# Patient Record
Sex: Male | Born: 1950 | Race: White | Hispanic: No | Marital: Married | Smoking: Current every day smoker
Health system: Southern US, Community
[De-identification: ages and names within clinical notes are randomized; demographics above are authoritative.]

## PROBLEM LIST (undated history)

## (undated) DIAGNOSIS — K5792 Diverticulitis of intestine, part unspecified, without perforation or abscess without bleeding: Secondary | ICD-10-CM

## (undated) DIAGNOSIS — K219 Gastro-esophageal reflux disease without esophagitis: Secondary | ICD-10-CM

## (undated) DIAGNOSIS — N529 Male erectile dysfunction, unspecified: Secondary | ICD-10-CM

## (undated) DIAGNOSIS — I2510 Atherosclerotic heart disease of native coronary artery without angina pectoris: Secondary | ICD-10-CM

## (undated) DIAGNOSIS — J449 Chronic obstructive pulmonary disease, unspecified: Secondary | ICD-10-CM

## (undated) DIAGNOSIS — G4730 Sleep apnea, unspecified: Secondary | ICD-10-CM

## (undated) DIAGNOSIS — I272 Pulmonary hypertension, unspecified: Secondary | ICD-10-CM

## (undated) DIAGNOSIS — K279 Peptic ulcer, site unspecified, unspecified as acute or chronic, without hemorrhage or perforation: Secondary | ICD-10-CM

## (undated) DIAGNOSIS — K635 Polyp of colon: Secondary | ICD-10-CM

## (undated) DIAGNOSIS — I4891 Unspecified atrial fibrillation: Secondary | ICD-10-CM

## (undated) DIAGNOSIS — I10 Essential (primary) hypertension: Secondary | ICD-10-CM

## (undated) DIAGNOSIS — I509 Heart failure, unspecified: Secondary | ICD-10-CM

## (undated) DIAGNOSIS — E785 Hyperlipidemia, unspecified: Secondary | ICD-10-CM

## (undated) HISTORY — PX: HERNIA REPAIR: SHX51

---

## 2004-12-07 HISTORY — PX: COLONOSCOPY: SHX174

## 2016-02-17 ENCOUNTER — Encounter: Payer: Self-pay | Admitting: *Deleted

## 2016-02-20 ENCOUNTER — Ambulatory Visit
Admission: RE | Admit: 2016-02-20 | Discharge: 2016-02-20 | Disposition: A | Discharge status: Home / Self Care | Payer: BLUE CROSS/BLUE SHIELD | Source: Ambulatory Visit | Attending: Gastroenterology | Admitting: Gastroenterology

## 2016-02-20 ENCOUNTER — Encounter
Admission: RE | Disposition: A | Discharge status: Home / Self Care | Payer: Self-pay | Source: Ambulatory Visit | Attending: Gastroenterology

## 2016-02-20 ENCOUNTER — Encounter: Payer: Self-pay | Admitting: *Deleted

## 2016-02-20 ENCOUNTER — Ambulatory Visit: Payer: BLUE CROSS/BLUE SHIELD | Admitting: Anesthesiology

## 2016-02-20 DIAGNOSIS — J449 Chronic obstructive pulmonary disease, unspecified: Secondary | ICD-10-CM | POA: Insufficient documentation

## 2016-02-20 DIAGNOSIS — I2510 Atherosclerotic heart disease of native coronary artery without angina pectoris: Secondary | ICD-10-CM | POA: Insufficient documentation

## 2016-02-20 DIAGNOSIS — D124 Benign neoplasm of descending colon: Secondary | ICD-10-CM | POA: Insufficient documentation

## 2016-02-20 DIAGNOSIS — E785 Hyperlipidemia, unspecified: Secondary | ICD-10-CM | POA: Diagnosis not present

## 2016-02-20 DIAGNOSIS — F17210 Nicotine dependence, cigarettes, uncomplicated: Secondary | ICD-10-CM | POA: Insufficient documentation

## 2016-02-20 DIAGNOSIS — Z7951 Long term (current) use of inhaled steroids: Secondary | ICD-10-CM | POA: Insufficient documentation

## 2016-02-20 DIAGNOSIS — K5730 Diverticulosis of large intestine without perforation or abscess without bleeding: Secondary | ICD-10-CM | POA: Insufficient documentation

## 2016-02-20 DIAGNOSIS — I4891 Unspecified atrial fibrillation: Secondary | ICD-10-CM | POA: Insufficient documentation

## 2016-02-20 DIAGNOSIS — Z8711 Personal history of peptic ulcer disease: Secondary | ICD-10-CM | POA: Diagnosis not present

## 2016-02-20 DIAGNOSIS — Z1211 Encounter for screening for malignant neoplasm of colon: Secondary | ICD-10-CM | POA: Insufficient documentation

## 2016-02-20 DIAGNOSIS — D125 Benign neoplasm of sigmoid colon: Secondary | ICD-10-CM | POA: Insufficient documentation

## 2016-02-20 DIAGNOSIS — G4730 Sleep apnea, unspecified: Secondary | ICD-10-CM | POA: Diagnosis not present

## 2016-02-20 DIAGNOSIS — K219 Gastro-esophageal reflux disease without esophagitis: Secondary | ICD-10-CM | POA: Diagnosis not present

## 2016-02-20 DIAGNOSIS — I10 Essential (primary) hypertension: Secondary | ICD-10-CM | POA: Diagnosis not present

## 2016-02-20 DIAGNOSIS — Z888 Allergy status to other drugs, medicaments and biological substances status: Secondary | ICD-10-CM | POA: Diagnosis not present

## 2016-02-20 DIAGNOSIS — Z7982 Long term (current) use of aspirin: Secondary | ICD-10-CM | POA: Diagnosis not present

## 2016-02-20 DIAGNOSIS — I252 Old myocardial infarction: Secondary | ICD-10-CM | POA: Insufficient documentation

## 2016-02-20 DIAGNOSIS — Z79899 Other long term (current) drug therapy: Secondary | ICD-10-CM | POA: Insufficient documentation

## 2016-02-20 HISTORY — PX: COLONOSCOPY WITH PROPOFOL: SHX5780

## 2016-02-20 HISTORY — DX: Sleep apnea, unspecified: G47.30

## 2016-02-20 HISTORY — DX: Male erectile dysfunction, unspecified: N52.9

## 2016-02-20 HISTORY — DX: Peptic ulcer, site unspecified, unspecified as acute or chronic, without hemorrhage or perforation: K27.9

## 2016-02-20 HISTORY — DX: Unspecified atrial fibrillation: I48.91

## 2016-02-20 HISTORY — DX: Diverticulitis of intestine, part unspecified, without perforation or abscess without bleeding: K57.92

## 2016-02-20 HISTORY — DX: Chronic obstructive pulmonary disease, unspecified: J44.9

## 2016-02-20 HISTORY — DX: Pulmonary hypertension, unspecified: I27.20

## 2016-02-20 HISTORY — DX: Gastro-esophageal reflux disease without esophagitis: K21.9

## 2016-02-20 HISTORY — DX: Essential (primary) hypertension: I10

## 2016-02-20 HISTORY — DX: Atherosclerotic heart disease of native coronary artery without angina pectoris: I25.10

## 2016-02-20 HISTORY — DX: Hyperlipidemia, unspecified: E78.5

## 2016-02-20 LAB — CBC WITH DIFFERENTIAL/PLATELET
BASOS PCT: 2 %
Basophils Absolute: 0.3 10*3/uL — ABNORMAL HIGH (ref 0–0.1)
EOS ABS: 0.1 10*3/uL (ref 0–0.7)
Eosinophils Relative: 0 %
HCT: 48 % (ref 40.0–52.0)
HEMOGLOBIN: 16.2 g/dL (ref 13.0–18.0)
Lymphocytes Relative: 9 %
Lymphs Abs: 1.2 10*3/uL (ref 1.0–3.6)
MCH: 32.2 pg (ref 26.0–34.0)
MCHC: 33.7 g/dL (ref 32.0–36.0)
MCV: 95.6 fL (ref 80.0–100.0)
Monocytes Absolute: 0.9 10*3/uL (ref 0.2–1.0)
Monocytes Relative: 7 %
NEUTROS PCT: 82 %
Neutro Abs: 10.9 10*3/uL — ABNORMAL HIGH (ref 1.4–6.5)
Platelets: 284 10*3/uL (ref 150–440)
RBC: 5.02 MIL/uL (ref 4.40–5.90)
RDW: 14.3 % (ref 11.5–14.5)
WBC: 13.3 10*3/uL — AB (ref 3.8–10.6)

## 2016-02-20 LAB — PROTIME-INR
INR: 0.99
PROTHROMBIN TIME: 13.3 s (ref 11.4–15.0)

## 2016-02-20 SURGERY — COLONOSCOPY WITH PROPOFOL
Anesthesia: General

## 2016-02-20 MED ORDER — PROPOFOL 10 MG/ML IV BOLUS
INTRAVENOUS | Status: DC | PRN
  Administered 2016-02-20: 40 mg via INTRAVENOUS

## 2016-02-20 MED ORDER — MIDAZOLAM HCL 5 MG/5ML IJ SOLN
INTRAMUSCULAR | Status: DC | PRN
  Administered 2016-02-20: 2 mg via INTRAVENOUS

## 2016-02-20 MED ORDER — SODIUM CHLORIDE 0.9 % IV SOLN: INTRAVENOUS | Status: DC

## 2016-02-20 MED ORDER — LIDOCAINE HCL (CARDIAC) 20 MG/ML IV SOLN
INTRAVENOUS | Status: DC | PRN
  Administered 2016-02-20: 40 mg via INTRAVENOUS

## 2016-02-20 MED ORDER — PROPOFOL 500 MG/50ML IV EMUL
INTRAVENOUS | Status: DC | PRN
  Administered 2016-02-20: 140 ug/kg/min via INTRAVENOUS

## 2016-02-20 MED ORDER — FENTANYL CITRATE (PF) 100 MCG/2ML IJ SOLN
INTRAMUSCULAR | Status: DC | PRN
  Administered 2016-02-20: 50 ug via INTRAVENOUS

## 2016-02-20 MED ORDER — SODIUM CHLORIDE 0.9 % IV SOLN
INTRAVENOUS | Status: DC
  Administered 2016-02-20: 13:00:00 via INTRAVENOUS

## 2016-02-20 MED ORDER — PHENYLEPHRINE HCL 10 MG/ML IJ SOLN
INTRAMUSCULAR | Status: DC | PRN
  Administered 2016-02-20: 80 ug via INTRAVENOUS
  Administered 2016-02-20: 40 ug via INTRAVENOUS
  Administered 2016-02-20: 80 ug via INTRAVENOUS

## 2016-02-20 NOTE — H&P (Signed)
Outpatient short stay form Pre-procedure 02/20/2016 1:00 PM Lollie Sails MD  Primary Physician: Dr Sherrin Daisy  Reason for visit:  Colonoscopy  History of present illness:  Patient is a 65 year old male presenting today for colonoscopy. He has a personal history of colon polyps. He tolerated his prep well. He takes no tenderness. He does take an 81 mg aspirin that he is held for several days.    Current facility-administered medications:  .  0.9 %  sodium chloride infusion, , Intravenous, Continuous, Lollie Sails, MD, Last Rate: 20 mL/hr at 02/20/16 1230 .  0.9 %  sodium chloride infusion, , Intravenous, Continuous, Lollie Sails, MD  Prescriptions prior to admission  Medication Sig Dispense Refill Last Dose  . albuterol (PROVENTIL HFA;VENTOLIN HFA) 108 (90 Base) MCG/ACT inhaler Inhale 2 puffs into the lungs every 6 (six) hours as needed for wheezing or shortness of breath.   02/20/2016 at 1130  . albuterol (PROVENTIL) (2.5 MG/3ML) 0.083% nebulizer solution Take 2.5 mg by nebulization every 6 (six) hours as needed for wheezing or shortness of breath.    at time  . aspirin 81 MG tablet Take 81 mg by mouth daily.     Marland Kitchen atorvastatin (LIPITOR) 40 MG tablet Take 40 mg by mouth daily.     . budesonide-formoterol (SYMBICORT) 160-4.5 MCG/ACT inhaler Inhale 2 puffs into the lungs 2 (two) times daily.     . metoprolol succinate (TOPROL-XL) 100 MG 24 hr tablet Take 100 mg by mouth daily. Take with or immediately following a meal.   02/20/2016 at 0630  . Multiple Vitamin (MULTIVITAMIN) tablet Take 1 tablet by mouth daily.     . nitroGLYCERIN (NITROSTAT) 0.4 MG SL tablet Place 0.4 mg under the tongue every 5 (five) minutes as needed for chest pain.     Marland Kitchen tiotropium (SPIRIVA) 18 MCG inhalation capsule Place 18 mcg into inhaler and inhale daily.        Allergies  Allergen Reactions  . Chantix [Varenicline]      Past Medical History  Diagnosis Date  . Atrial fibrillation ([place])   .  Coronary artery disease   . COPD (chronic obstructive pulmonary disease) ([place])   . Diverticulitis   . ED (erectile dysfunction)   . GERD (gastroesophageal reflux disease)   . Hyperlipidemia   . Hypertension   . PUD (peptic ulcer disease)   . Pulmonary hypertension ([place])   . Sleep apnea     Review of systems:      Physical Exam    Heart and lungs: Irregular rate and rhythm, lungs are clear    HEENT: Normocephalic atraumatic eyes are anicteric    Other:     Pertinant exam for procedure: Protuberant, soft nontender nondistended bowel sounds are positive normoactive    Planned proceedures: Colonoscopy and indicated procedures. I have discussed the risks benefits and complications of procedures to include not limited to bleeding, infection, perforation and the risk of sedation and the patient wishes to proceed.    Lollie Sails, MD Gastroenterology 02/20/2016  1:00 PM

## 2016-02-20 NOTE — Op Note (Signed)
[facility] Gastroenterology Patient Name: Chad Hess Procedure Date: 02/20/2016 1:25 PM MRN: [id number] Account #: [id number] Date of Birth: [birth year] Admit Type: Outpatient Age: 65 Room: [facility] ENDO ROOM 2 Gender: Male Note Status: Finalized Procedure:            Colonoscopy Indications:          Personal history of colonic polyps Providers:            Lollie Sails, MD Referring MD:         Shirline Frees (Referring MD) Medicines:            Monitored Anesthesia Care Complications:        No immediate complications. Procedure:            Pre-Anesthesia Assessment:                       - ASA Grade Assessment: III - A patient with severe                        systemic disease.                       After obtaining informed consent, the colonoscope was                        passed under direct vision. Throughout the procedure,                        the patient's blood pressure, pulse, and oxygen                        saturations were monitored continuously. The                        Colonoscope was introduced through the anus with the                        intention of advancing to the cecum. The scope was                        advanced to the descending colon before the procedure                        was aborted. Medications were given. The colonoscopy                        was extremely difficult due to poor bowel prep. The                        patient tolerated the procedure well. The quality of                        the bowel preparation was poor. Findings:      Multiple small to medium diverticula were found in the sigmoid colon and       descending colon.      A few polyps were found in the sigmoid colon and descending colon.       Polypectomy was not attempted due to inadequate bowel preparation and       poor endoscopic visualization. Impression:           -  Preparation of the colon was poor.                       -  Diverticulosis in the sigmoid colon and in the                        descending colon.                       - A few polyps in the sigmoid colon and in the                        descending colon. Resection not attempted.                       - No specimens collected. Recommendation:       - Discharge patient to home.                       - Put patient on a clear liquid diet starting today.                       - reprep and reschedule Procedure Code(s):    --- Professional ---                       [phone], 53, Colonoscopy, flexible; diagnostic, including                        collection of specimen(s) by brushing or washing, when                        performed (separate procedure) Diagnosis Code(s):    --- Professional ---                       D12.5, Benign neoplasm of sigmoid colon                       D12.4, Benign neoplasm of descending colon                       Z86.010, Personal history of colonic polyps                       K57.30, Diverticulosis of large intestine without                        perforation or abscess without bleeding CPT copyright 2016 [employer]. All rights reserved. The codes documented in this report are preliminary and upon coder review may  be revised to meet current compliance requirements. Lollie Sails, MD 02/20/2016 1:47:51 PM This report has been signed electronically. Number of Addenda: 0 Note Initiated On: 02/20/2016 1:25 PM Total Procedure Duration: 0 hours 8 minutes 39 seconds       [facility]

## 2016-02-20 NOTE — Anesthesia Procedure Notes (Signed)
Date/Time: 02/20/2016 1:35 PM Performed by: Allean Found Pre-anesthesia Checklist: Patient identified, Emergency Drugs available, Suction available, Patient being monitored and Timeout performed Patient Re-evaluated:Patient Re-evaluated prior to inductionOxygen Delivery Method: Nasal cannula Preoxygenation: Pre-oxygenation with 100% oxygen Intubation Type: IV induction

## 2016-02-20 NOTE — Anesthesia Preprocedure Evaluation (Signed)
Anesthesia Evaluation  Patient identified by MRN, date of birth, ID band Patient awake    Reviewed: Allergy & Precautions, NPO status , Patient's Chart, lab work & pertinent test results, reviewed documented beta blocker date and time   History of Anesthesia Complications Negative for: history of anesthetic complications  Airway Mallampati: III       Dental  (+) Partial Upper, Lower Dentures   Pulmonary sleep apnea (not using CPAP) and Continuous Positive Airway Pressure Ventilation , COPD,  COPD inhaler, Current Smoker,           Cardiovascular hypertension, Pt. on medications and Pt. on home beta blockers + CAD and + Past MI  + dysrhythmias Atrial Fibrillation      Neuro/Psych negative neurological ROS     GI/Hepatic Neg liver ROS, PUD, GERD (resolved)  ,  Endo/Other  negative endocrine ROS  Renal/GU negative Renal ROS     Musculoskeletal   Abdominal   Peds  Hematology   Anesthesia Other Findings   Reproductive/Obstetrics                             Anesthesia Physical Anesthesia Plan  ASA: III  Anesthesia Plan: General   Post-op Pain Management:    Induction: Intravenous  Airway Management Planned: Nasal Cannula  Additional Equipment:   Intra-op Plan:   Post-operative Plan:   Informed Consent: I have reviewed the patients History and Physical, chart, labs and discussed the procedure including the risks, benefits and alternatives for the proposed anesthesia with the patient or authorized representative who has indicated his/her understanding and acceptance.     Plan Discussed with:   Anesthesia Plan Comments:         Anesthesia Quick Evaluation

## 2016-02-20 NOTE — Transfer of Care (Signed)
Immediate Anesthesia Transfer of Care Note  Patient: Chad Hess  Procedure(s) Performed: Procedure(s): COLONOSCOPY WITH PROPOFOL (N/A)  Patient Location: PACU  Anesthesia Type:General  Level of Consciousness: awake  Airway & Oxygen Therapy: Patient Spontanous Breathing and Patient connected to nasal cannula oxygen  Post-op Assessment: Report given to RN and Post -op Vital signs reviewed and stable  Post vital signs: Reviewed and stable  Last Vitals:  Filed Vitals:   02/20/16 1156 02/20/16 1401  BP: 144/86 102/57  Pulse: 105 88  Temp: 36.4 C 36.1 C  Resp: 19 21    Complications: No apparent anesthesia complications

## 2016-02-21 ENCOUNTER — Encounter: Payer: Self-pay | Admitting: Gastroenterology

## 2016-02-21 NOTE — Anesthesia Postprocedure Evaluation (Signed)
Anesthesia Post Note  Patient: Chad Hess  Procedure(s) Performed: Procedure(s) (LRB): COLONOSCOPY WITH PROPOFOL (N/A)  Patient location during evaluation: Endoscopy Anesthesia Type: General Level of consciousness: awake and alert Pain management: pain level controlled Vital Signs Assessment: post-procedure vital signs reviewed and stable Respiratory status: spontaneous breathing and respiratory function stable Cardiovascular status: stable Anesthetic complications: no    Last Vitals:  Filed Vitals:   02/20/16 1421 02/20/16 1431  BP: 124/89 127/87  Pulse: 77 95  Temp:    Resp: 20 23    Last Pain:  Filed Vitals:   02/21/16 0752  PainSc: 0-No pain                 KEPHART,WILLIAM K

## 2016-03-30 ENCOUNTER — Encounter: Payer: Self-pay | Admitting: *Deleted

## 2016-04-02 ENCOUNTER — Ambulatory Visit: Payer: BLUE CROSS/BLUE SHIELD | Admitting: Anesthesiology

## 2016-04-02 ENCOUNTER — Ambulatory Visit
Admission: RE | Admit: 2016-04-02 | Discharge: 2016-04-02 | Disposition: A | Discharge status: Home / Self Care | Payer: BLUE CROSS/BLUE SHIELD | Source: Ambulatory Visit | Attending: Gastroenterology | Admitting: Gastroenterology

## 2016-04-02 ENCOUNTER — Encounter
Admission: RE | Disposition: A | Discharge status: Home / Self Care | Payer: Self-pay | Source: Ambulatory Visit | Attending: Gastroenterology

## 2016-04-02 ENCOUNTER — Encounter: Payer: Self-pay | Admitting: *Deleted

## 2016-04-02 DIAGNOSIS — I4891 Unspecified atrial fibrillation: Secondary | ICD-10-CM | POA: Insufficient documentation

## 2016-04-02 DIAGNOSIS — Z86010 Personal history of colonic polyps: Secondary | ICD-10-CM | POA: Insufficient documentation

## 2016-04-02 DIAGNOSIS — Z7982 Long term (current) use of aspirin: Secondary | ICD-10-CM | POA: Insufficient documentation

## 2016-04-02 DIAGNOSIS — I10 Essential (primary) hypertension: Secondary | ICD-10-CM | POA: Diagnosis not present

## 2016-04-02 DIAGNOSIS — G4730 Sleep apnea, unspecified: Secondary | ICD-10-CM | POA: Insufficient documentation

## 2016-04-02 DIAGNOSIS — K621 Rectal polyp: Secondary | ICD-10-CM | POA: Insufficient documentation

## 2016-04-02 DIAGNOSIS — E785 Hyperlipidemia, unspecified: Secondary | ICD-10-CM | POA: Diagnosis not present

## 2016-04-02 DIAGNOSIS — K219 Gastro-esophageal reflux disease without esophagitis: Secondary | ICD-10-CM | POA: Diagnosis not present

## 2016-04-02 DIAGNOSIS — Z5309 Procedure and treatment not carried out because of other contraindication: Secondary | ICD-10-CM | POA: Diagnosis not present

## 2016-04-02 DIAGNOSIS — Z888 Allergy status to other drugs, medicaments and biological substances status: Secondary | ICD-10-CM | POA: Diagnosis not present

## 2016-04-02 DIAGNOSIS — I2510 Atherosclerotic heart disease of native coronary artery without angina pectoris: Secondary | ICD-10-CM | POA: Diagnosis not present

## 2016-04-02 DIAGNOSIS — J449 Chronic obstructive pulmonary disease, unspecified: Secondary | ICD-10-CM | POA: Diagnosis not present

## 2016-04-02 DIAGNOSIS — Z7951 Long term (current) use of inhaled steroids: Secondary | ICD-10-CM | POA: Insufficient documentation

## 2016-04-02 DIAGNOSIS — Z8711 Personal history of peptic ulcer disease: Secondary | ICD-10-CM | POA: Diagnosis not present

## 2016-04-02 DIAGNOSIS — D125 Benign neoplasm of sigmoid colon: Secondary | ICD-10-CM | POA: Insufficient documentation

## 2016-04-02 DIAGNOSIS — I272 Other secondary pulmonary hypertension: Secondary | ICD-10-CM | POA: Insufficient documentation

## 2016-04-02 HISTORY — DX: Polyp of colon: K63.5

## 2016-04-02 HISTORY — PX: COLONOSCOPY WITH PROPOFOL: SHX5780

## 2016-04-02 LAB — CBC
HEMATOCRIT: 48.8 % (ref 40.0–52.0)
HEMOGLOBIN: 16.4 g/dL (ref 13.0–18.0)
MCH: 32.5 pg (ref 26.0–34.0)
MCHC: 33.6 g/dL (ref 32.0–36.0)
MCV: 96.6 fL (ref 80.0–100.0)
Platelets: 257 10*3/uL (ref 150–440)
RBC: 5.05 MIL/uL (ref 4.40–5.90)
RDW: 14.1 % (ref 11.5–14.5)
WBC: 9.9 10*3/uL (ref 3.8–10.6)

## 2016-04-02 LAB — PROTIME-INR
INR: 1
Prothrombin Time: 13.4 seconds (ref 11.4–15.0)

## 2016-04-02 SURGERY — COLONOSCOPY WITH PROPOFOL
Anesthesia: General

## 2016-04-02 MED ORDER — PROPOFOL 10 MG/ML IV BOLUS
INTRAVENOUS | Status: DC | PRN
  Administered 2016-04-02: 40 mg via INTRAVENOUS

## 2016-04-02 MED ORDER — LIDOCAINE HCL (PF) 1 % IJ SOLN
2.0000 mL | Freq: Once | INTRAMUSCULAR | Status: DC
  Filled 2016-04-02: qty 2

## 2016-04-02 MED ORDER — LIDOCAINE HCL (CARDIAC) 20 MG/ML IV SOLN
INTRAVENOUS | Status: DC | PRN
  Administered 2016-04-02: 80 mg via INTRAVENOUS

## 2016-04-02 MED ORDER — FENTANYL CITRATE (PF) 100 MCG/2ML IJ SOLN
INTRAMUSCULAR | Status: DC | PRN
  Administered 2016-04-02: 50 ug via INTRAVENOUS

## 2016-04-02 MED ORDER — MIDAZOLAM HCL 5 MG/5ML IJ SOLN
INTRAMUSCULAR | Status: DC | PRN
  Administered 2016-04-02: 1 mg via INTRAVENOUS

## 2016-04-02 MED ORDER — SODIUM CHLORIDE 0.9 % IV SOLN: INTRAVENOUS | Status: DC

## 2016-04-02 MED ORDER — ALBUTEROL SULFATE HFA 108 (90 BASE) MCG/ACT IN AERS
INHALATION_SPRAY | RESPIRATORY_TRACT | Status: DC | PRN
  Administered 2016-04-02: 2.5 via RESPIRATORY_TRACT

## 2016-04-02 MED ORDER — SODIUM CHLORIDE 0.9 % IV SOLN
INTRAVENOUS | Status: DC
  Administered 2016-04-02: 1000 mL via INTRAVENOUS

## 2016-04-02 NOTE — Brief Op Note (Signed)
Colonoscopy aborted due to poor prep and minimal visualization of walls of colon.

## 2016-04-02 NOTE — Transfer of Care (Signed)
Immediate Anesthesia Transfer of Care Note  Patient: Chad Hess  Procedure(s) Performed: Procedure(s): COLONOSCOPY WITH PROPOFOL (N/A)  Patient Location: PACU  Anesthesia Type:General  Level of Consciousness: awake  Airway & Oxygen Therapy: Patient Spontanous Breathing and Patient connected to nasal cannula oxygen  Post-op Assessment: Report given to RN and Post -op Vital signs reviewed and stable  Post vital signs: Reviewed and stable  Last Vitals:  Filed Vitals:   04/02/16 1516 04/02/16 1521  BP:  124/90  Pulse:  102  Temp:  35.8 C  Resp: 0 17    Last Pain: There were no vitals filed for this visit.       Complications: No apparent anesthesia complications

## 2016-04-02 NOTE — H&P (Signed)
Outpatient short stay form Pre-procedure 04/02/2016 2:32 PM Chad Sails MD  Primary Physician: Dr Sherrin Daisy   Reason for visit:  Colonoscopy  History of present illness:  Patient is a 65 year old male presenting today with personal history of adenomatous colon polyps. We had attempted a colonoscopy several weeks ago however the prep was not adequate necessitating rescheduling. He tolerated his prep well this time. There is no history of blood thinners currently. He does have atrial fibrillation but has not been started on a blood thinner. He does take a 81 mg aspirin which she has held for several times.    Current facility-administered medications:  .  0.9 %  sodium chloride infusion, , Intravenous, Continuous, Chad Sails, MD, Last Rate: 20 mL/hr at 04/02/16 1425, 1,000 mL at 04/02/16 1425 .  0.9 %  sodium chloride infusion, , Intravenous, Continuous, Chad Sails, MD .  lidocaine (PF) (XYLOCAINE) 1 % injection 2 mL, 2 mL, Intradermal, Once, Chad Sails, MD  Prescriptions prior to admission  Medication Sig Dispense Refill Last Dose  . albuterol (PROVENTIL HFA;VENTOLIN HFA) 108 (90 Base) MCG/ACT inhaler Inhale 2 puffs into the lungs every 6 (six) hours as needed for wheezing or shortness of breath.   04/02/2016 at 1200  . atorvastatin (LIPITOR) 40 MG tablet Take 40 mg by mouth daily.   04/02/2016 at 0830  . metoprolol succinate (TOPROL-XL) 100 MG 24 hr tablet Take 100 mg by mouth daily. Take with or immediately following a meal.   04/02/2016 at 0830  . Multiple Vitamin (MULTIVITAMIN) tablet Take 1 tablet by mouth daily.   Past Week at Unknown time  . tiotropium (SPIRIVA) 18 MCG inhalation capsule Place 18 mcg into inhaler and inhale daily. Reported on 04/02/2016   Past Week at Unknown time  . albuterol (PROVENTIL) (2.5 MG/3ML) 0.083% nebulizer solution Take 2.5 mg by nebulization every 6 (six) hours as needed for wheezing or shortness of breath.    at time  . aspirin  81 MG tablet Take 81 mg by mouth daily.   03/23/2016  . budesonide-formoterol (SYMBICORT) 160-4.5 MCG/ACT inhaler Inhale 2 puffs into the lungs 2 (two) times daily.     . nitroGLYCERIN (NITROSTAT) 0.4 MG SL tablet Place 0.4 mg under the tongue every 5 (five) minutes as needed for chest pain.        Allergies  Allergen Reactions  . Chantix [Varenicline]      Past Medical History  Diagnosis Date  . Atrial fibrillation ([place])   . Coronary artery disease   . COPD (chronic obstructive pulmonary disease) ([place])   . Diverticulitis   . ED (erectile dysfunction)   . GERD (gastroesophageal reflux disease)   . Hyperlipidemia   . Hypertension   . PUD (peptic ulcer disease)   . Pulmonary hypertension ([place])   . Colon polyp   . Sleep apnea     noncompliant with CPAP    Review of systems:      Physical Exam    Heart and lungs: Irregular irregular, clear to auscultation    HEENT: Normocephalic atraumatic eyes are anicteric    Other:     Pertinant exam for procedure: Protuberant soft nontender nondistended bowel sounds positive normoactive.    Planned proceedures: Colonoscopy and indicated procedures. I have discussed the risks benefits and complications of procedures to include not limited to bleeding, infection, perforation and the risk of sedation and the patient wishes to proceed.    Chad Sails, MD Gastroenterology 04/02/2016  2:32 PM

## 2016-04-02 NOTE — Anesthesia Preprocedure Evaluation (Signed)
Anesthesia Evaluation  Patient identified by MRN, date of birth, ID band Patient awake    Reviewed: Allergy & Precautions, H&P , NPO status , Patient's Chart, lab work & pertinent test results, reviewed documented beta blocker date and time   Airway Mallampati: II   Neck ROM: full    Dental  (+) Poor Dentition, Partial Upper, Partial Lower   Pulmonary neg pulmonary ROS, sleep apnea and Continuous Positive Airway Pressure Ventilation , COPD,  COPD inhaler, Current Smoker,    Pulmonary exam normal        Cardiovascular hypertension, + CAD  negative cardio ROS Normal cardiovascular examAtrial Fibrillation  Rate:Normal     Neuro/Psych negative neurological ROS  negative psych ROS   GI/Hepatic negative GI ROS, Neg liver ROS, PUD, GERD  Medicated,  Endo/Other  negative endocrine ROS  Renal/GU negative Renal ROS  negative genitourinary   Musculoskeletal   Abdominal   Peds  Hematology negative hematology ROS (+)   Anesthesia Other Findings Past Medical History:   Atrial fibrillation ([facility])                                    Coronary artery disease                                      COPD (chronic obstructive pulmonary disease) (*              Diverticulitis                                               ED (erectile dysfunction)                                    GERD (gastroesophageal reflux disease)                       Hyperlipidemia                                               Hypertension                                                 PUD (peptic ulcer disease)                                   Pulmonary hypertension ([facility])                                 Colon polyp  Sleep apnea                                                    Comment:noncompliant with CPAP Past Surgical History:   HERNIA REPAIR                                   Right              COLONOSCOPY                                       12/07/2004    COLONOSCOPY WITH PROPOFOL                       N/A 02/20/2016       Comment:Procedure: COLONOSCOPY WITH PROPOFOL;  Surgeon:              Lollie Sails, MD;  Location: [facility]               ENDOSCOPY;  Service: Endoscopy;  Laterality:               N/A; BMI    Body Mass Index   28.18 kg/m 2     Reproductive/Obstetrics                             Anesthesia Physical Anesthesia Plan  ASA: III  Anesthesia Plan: General   Post-op Pain Management:    Induction:   Airway Management Planned:   Additional Equipment:   Intra-op Plan:   Post-operative Plan:   Informed Consent: I have reviewed the patients History and Physical, chart, labs and discussed the procedure including the risks, benefits and alternatives for the proposed anesthesia with the patient or authorized representative who has indicated his/her understanding and acceptance.   Dental Advisory Given  Plan Discussed with: CRNA  Anesthesia Plan Comments:         Anesthesia Quick Evaluation

## 2016-04-02 NOTE — Op Note (Signed)
[facility] Gastroenterology Patient Name: Chad Hess Procedure Date: 04/02/2016 2:49 PM MRN: [id number] Account #: [id number] Date of Birth: 1951/01/17 Admit Type: Outpatient Age: 65 Room: [facility] ENDO ROOM 3 Gender: Male Note Status: Finalized Procedure:            Colonoscopy Indications:          Personal history of colonic polyps Providers:            Lollie Sails, MD Referring MD:         Shirline Frees (Referring MD) Medicines:            Monitored Anesthesia Care Complications:        No immediate complications. Procedure:            Pre-Anesthesia Assessment:                       - ASA Grade Assessment: III - A patient with severe                        systemic disease.                       After obtaining informed consent, the colonoscope was                        passed under direct vision. Throughout the procedure,                        the patient's blood pressure, pulse, and oxygen                        saturations were monitored continuously. The                        Colonoscope was introduced through the anus with the                        intention of advancing to the cecum. The scope was                        advanced to the sigmoid colon before the procedure was                        aborted. Medications were given. The colonoscopy was                        extremely difficult due to poor bowel prep. The patient                        tolerated the procedure well. Findings:      prep is inadequate for further evaluation, 2 small polyps were noted in       rectum and distal sigmoid, however prep is inadequate to proceed. Impression:           - No specimens collected. Recommendation:       - Discharge patient to home.                       - Put patient on a clear liquid diet starting today.                       -  try to continue clears, repeat prep and reschedule to                        tomorrow. Procedure Code(s):     --- Professional ---                       [id number], 19, Colonoscopy, flexible; diagnostic, including                        collection of specimen(s) by brushing or washing, when                        performed (separate procedure) Diagnosis Code(s):    --- Professional ---                       Z86.010, Personal history of colonic polyps CPT copyright 2016 American Medical Association. All rights reserved. The codes documented in this report are preliminary and upon coder review may  be revised to meet current compliance requirements. Lollie Sails, MD 04/02/2016 3:17:41 PM This report has been signed electronically. Number of Addenda: 0 Note Initiated On: 04/02/2016 2:49 PM Total Procedure Duration: 0 hours 6 minutes 44 seconds       [facility]

## 2016-04-03 ENCOUNTER — Ambulatory Visit
Admission: RE | Admit: 2016-04-03 | Discharge: 2016-04-03 | Disposition: A | Discharge status: Home / Self Care | Payer: BLUE CROSS/BLUE SHIELD | Source: Ambulatory Visit | Attending: Gastroenterology | Admitting: Gastroenterology

## 2016-04-03 ENCOUNTER — Ambulatory Visit: Payer: BLUE CROSS/BLUE SHIELD | Admitting: Anesthesiology

## 2016-04-03 ENCOUNTER — Encounter: Payer: Self-pay | Admitting: *Deleted

## 2016-04-03 ENCOUNTER — Encounter
Admission: RE | Disposition: A | Discharge status: Home / Self Care | Payer: Self-pay | Source: Ambulatory Visit | Attending: Gastroenterology

## 2016-04-03 DIAGNOSIS — J449 Chronic obstructive pulmonary disease, unspecified: Secondary | ICD-10-CM | POA: Diagnosis not present

## 2016-04-03 DIAGNOSIS — I2510 Atherosclerotic heart disease of native coronary artery without angina pectoris: Secondary | ICD-10-CM | POA: Diagnosis not present

## 2016-04-03 DIAGNOSIS — K640 First degree hemorrhoids: Secondary | ICD-10-CM | POA: Insufficient documentation

## 2016-04-03 DIAGNOSIS — G4730 Sleep apnea, unspecified: Secondary | ICD-10-CM | POA: Insufficient documentation

## 2016-04-03 DIAGNOSIS — I272 Other secondary pulmonary hypertension: Secondary | ICD-10-CM | POA: Diagnosis not present

## 2016-04-03 DIAGNOSIS — K621 Rectal polyp: Secondary | ICD-10-CM | POA: Insufficient documentation

## 2016-04-03 DIAGNOSIS — E785 Hyperlipidemia, unspecified: Secondary | ICD-10-CM | POA: Insufficient documentation

## 2016-04-03 DIAGNOSIS — Z888 Allergy status to other drugs, medicaments and biological substances status: Secondary | ICD-10-CM | POA: Diagnosis not present

## 2016-04-03 DIAGNOSIS — Z7982 Long term (current) use of aspirin: Secondary | ICD-10-CM | POA: Diagnosis not present

## 2016-04-03 DIAGNOSIS — I4891 Unspecified atrial fibrillation: Secondary | ICD-10-CM | POA: Insufficient documentation

## 2016-04-03 DIAGNOSIS — I10 Essential (primary) hypertension: Secondary | ICD-10-CM | POA: Insufficient documentation

## 2016-04-03 DIAGNOSIS — Z86010 Personal history of colonic polyps: Secondary | ICD-10-CM | POA: Insufficient documentation

## 2016-04-03 DIAGNOSIS — Z7951 Long term (current) use of inhaled steroids: Secondary | ICD-10-CM | POA: Insufficient documentation

## 2016-04-03 DIAGNOSIS — D125 Benign neoplasm of sigmoid colon: Secondary | ICD-10-CM | POA: Insufficient documentation

## 2016-04-03 DIAGNOSIS — K219 Gastro-esophageal reflux disease without esophagitis: Secondary | ICD-10-CM | POA: Insufficient documentation

## 2016-04-03 DIAGNOSIS — K5730 Diverticulosis of large intestine without perforation or abscess without bleeding: Secondary | ICD-10-CM | POA: Diagnosis not present

## 2016-04-03 DIAGNOSIS — Z79899 Other long term (current) drug therapy: Secondary | ICD-10-CM | POA: Diagnosis not present

## 2016-04-03 DIAGNOSIS — Z8711 Personal history of peptic ulcer disease: Secondary | ICD-10-CM | POA: Insufficient documentation

## 2016-04-03 DIAGNOSIS — Z1211 Encounter for screening for malignant neoplasm of colon: Secondary | ICD-10-CM | POA: Insufficient documentation

## 2016-04-03 HISTORY — PX: COLONOSCOPY: SHX5424

## 2016-04-03 SURGERY — COLONOSCOPY
Anesthesia: General

## 2016-04-03 MED ORDER — PROPOFOL 500 MG/50ML IV EMUL
INTRAVENOUS | Status: DC | PRN
  Administered 2016-04-03: 120 ug/kg/min via INTRAVENOUS

## 2016-04-03 MED ORDER — IPRATROPIUM-ALBUTEROL 0.5-2.5 (3) MG/3ML IN SOLN
RESPIRATORY_TRACT | Status: AC
  Administered 2016-04-03: 3 mL
  Filled 2016-04-03: qty 3

## 2016-04-03 MED ORDER — MIDAZOLAM HCL 2 MG/2ML IJ SOLN
INTRAMUSCULAR | Status: DC | PRN
  Administered 2016-04-03: 1 mg via INTRAVENOUS

## 2016-04-03 MED ORDER — FENTANYL CITRATE (PF) 100 MCG/2ML IJ SOLN
INTRAMUSCULAR | Status: DC | PRN
  Administered 2016-04-03: 50 ug via INTRAVENOUS

## 2016-04-03 MED ORDER — SODIUM CHLORIDE 0.9 % IV SOLN
INTRAVENOUS | Status: DC
  Administered 2016-04-03: 1000 mL via INTRAVENOUS

## 2016-04-03 MED ORDER — SODIUM CHLORIDE 0.9 % IV SOLN: INTRAVENOUS | Status: DC

## 2016-04-03 NOTE — Transfer of Care (Signed)
Immediate Anesthesia Transfer of Care Note  Patient: Chad Hess  Procedure(s) Performed: Procedure(s): COLONOSCOPY (N/A)  Patient Location: PACU  Anesthesia Type:General  Level of Consciousness: awake and sedated  Airway & Oxygen Therapy: Patient Spontanous Breathing and Patient connected to nasal cannula oxygen  Post-op Assessment: Report given to RN and Post -op Vital signs reviewed and stable  Post vital signs: Reviewed and stable  Last Vitals:  Filed Vitals:   04/03/16 0719  BP: 150/100  Pulse: 51  Temp: 36.1 C  Resp: 200    Last Pain: There were no vitals filed for this visit.       Complications: No apparent anesthesia complications

## 2016-04-03 NOTE — Anesthesia Preprocedure Evaluation (Signed)
Anesthesia Evaluation  Patient identified by MRN, date of birth, ID band Patient awake    Reviewed: Allergy & Precautions, H&P , NPO status , Patient's Chart, lab work & pertinent test results, reviewed documented beta blocker date and time   Airway Mallampati: II   Neck ROM: full    Dental  (+) Poor Dentition, Partial Upper, Partial Lower   Pulmonary neg pulmonary ROS, sleep apnea and Continuous Positive Airway Pressure Ventilation , COPD,  COPD inhaler, Current Smoker,    Pulmonary exam normal        Cardiovascular hypertension, + CAD  negative cardio ROS Normal cardiovascular exam Rate:Normal     Neuro/Psych negative neurological ROS  negative psych ROS   GI/Hepatic negative GI ROS, Neg liver ROS, PUD, GERD  Medicated,  Endo/Other  negative endocrine ROS  Renal/GU negative Renal ROS  negative genitourinary   Musculoskeletal   Abdominal   Peds  Hematology negative hematology ROS (+)   Anesthesia Other Findings Past Medical History:   Atrial fibrillation (H[facility])                                    Coronary artery disease                                      COPD (chronic obstructive pulmonary disease) (*              Diverticulitis                                               ED (erectile dysfunction)                                    GERD (gastroesophageal reflux disease)                       Hyperlipidemia                                               Hypertension                                                 PUD (peptic ulcer disease)                                   Pulmonary hypertension (HCC)                                 Colon polyp  Sleep apnea                                                    Comment:noncompliant with CPAP Past Surgical History:   HERNIA REPAIR                                   Right              COLONOSCOPY                                       12/07/2004    COLONOSCOPY WITH PROPOFOL                       N/A 02/20/2016       Comment:Procedure: COLONOSCOPY WITH PROPOFOL;  Surgeon:              Lollie Sails, MD;  Location[facility]               ENDOSCOPY;  Service: Endoscopy;  Laterality:               N/A; BMI    Body Mass Index   28.18 kg/m 2     Reproductive/Obstetrics                             Anesthesia Physical  Anesthesia Plan  ASA: III  Anesthesia Plan: General   Post-op Pain Management:    Induction:   Airway Management Planned:   Additional Equipment:   Intra-op Plan:   Post-operative Plan:   Informed Consent: I have reviewed the patients History and Physical, chart, labs and discussed the procedure including the risks, benefits and alternatives for the proposed anesthesia with the patient or authorized representative who has indicated his/her understanding and acceptance.   Dental Advisory Given  Plan Discussed with: CRNA  Anesthesia Plan Comments:         Anesthesia Quick Evaluation

## 2016-04-03 NOTE — Anesthesia Postprocedure Evaluation (Signed)
Anesthesia Post Note  Patient: Chad Hess  Procedure(s) Performed: Procedure(s) (LRB): COLONOSCOPY (N/A)  Patient location during evaluation: Endoscopy Anesthesia Type: General Level of consciousness: awake and alert Pain management: pain level controlled Vital Signs Assessment: post-procedure vital signs reviewed and stable Respiratory status: spontaneous breathing, nonlabored ventilation, respiratory function stable and patient connected to nasal cannula oxygen Cardiovascular status: blood pressure returned to baseline and stable Postop Assessment: no signs of nausea or vomiting Anesthetic complications: no    Last Vitals:  Filed Vitals:   04/03/16 0719 04/03/16 0820  BP: 150/100 88/66  Pulse: 51 89  Temp: 36.1 C 36 C  Resp: 200 21    Last Pain: There were no vitals filed for this visit.               Martha Clan

## 2016-04-03 NOTE — Anesthesia Procedure Notes (Signed)
Performed by: COOK-MARTIN, Zamar Odwyer Pre-anesthesia Checklist: Patient identified, Emergency Drugs available, Suction available, Patient being monitored and Timeout performed Patient Re-evaluated:Patient Re-evaluated prior to inductionOxygen Delivery Method: Nasal cannula Preoxygenation: Pre-oxygenation with 100% oxygen Intubation Type: IV induction Placement Confirmation: CO2 detector and positive ETCO2       

## 2016-04-03 NOTE — Op Note (Signed)
[facility] Gastroenterology Patient Name: Chad Hess Procedure Date: 04/03/2016 7:51 AM MRN: [id number] Account #: [id number] Date of Birth: [birth year] Admit Type: Outpatient Age: 65 Room: [facility] ENDO ROOM 3 Gender: Male Note Status: Finalized Procedure:            Colonoscopy Indications:          Personal history of colonic polyps Providers:            Lollie Sails, MD Referring MD:         Shirline Frees (Referring MD) Medicines:            Monitored Anesthesia Care Complications:        No immediate complications. Procedure:            Pre-Anesthesia Assessment:                       - ASA Grade Assessment: III - A patient with severe                        systemic disease.                       After obtaining informed consent, the colonoscope was                        passed under direct vision. Throughout the procedure,                        the patient's blood pressure, pulse, and oxygen                        saturations were monitored continuously. The                        Colonoscope was introduced through the anus and                        advanced to the the cecum, identified by appendiceal                        orifice and ileocecal valve. The colonoscopy was                        performed without difficulty. The patient tolerated the                        procedure well. The quality of the bowel preparation                        was good. Findings:      Multiple small to medium diverticula were found in the sigmoid colon,       descending colon and transverse colon.      A 2 mm polyp was found in the distal sigmoid colon. The polyp was       sessile. The polyp was removed with a cold biopsy forceps. Resection and       retrieval were complete.      A 8 mm polyp was found at 14 cm proximal to the anus. The polyp was       flat. The polyp was removed with a cold snare. Resection and  retrieval       were complete.      A 4 mm  polyp was found in the rectum. The polyp was sessile. The polyp       was removed with a cold snare. Resection and retrieval were complete.      A 2 mm polyp was found in the rectum. The polyp was sessile. The polyp       was removed with a cold biopsy forceps. Resection and retrieval were       complete.      Non-bleeding hemorrhoids were found during retroflexion and during       anoscopy. The hemorrhoids were small and Grade I (internal hemorrhoids       that do not prolapse). Impression:           - Diverticulosis in the sigmoid colon, in the                        descending colon and in the transverse colon.                       - One 2 mm polyp in the distal sigmoid colon, removed                        with a cold biopsy forceps. Resected and retrieved.                       - One 8 mm polyp at 14 cm proximal to the anus, removed                        with a cold snare. Resected and retrieved.                       - One 4 mm polyp in the rectum, removed with a cold                        snare. Resected and retrieved.                       - One 2 mm polyp in the rectum, removed with a cold                        biopsy forceps. Resected and retrieved.                       - Non-bleeding hemorrhoids. Recommendation:       - Discharge patient to home.                       - Await pathology results.                       - Telephone GI clinic for pathology results in 1 week. Procedure Code(s):    --- Professional ---                       [phone], Colonoscopy, flexible; with removal of tumor(s),                        polyp(s), or other lesion(s) by snare technique  [id number], 104, Colonoscopy, flexible; with biopsy, single                        or multiple Diagnosis Code(s):    --- Professional ---                       K64.0, First degree hemorrhoids                       D12.5, Benign neoplasm of sigmoid colon                       K62.1, Rectal polyp                        Z86.010, Personal history of colonic polyps                       K57.30, Diverticulosis of large intestine without                        perforation or abscess without bleeding CPT copyright 2016 American Medical Association. All rights reserved. The codes documented in this report are preliminary and upon coder review may  be revised to meet current compliance requirements. Lollie Sails, MD 04/03/2016 8:19:27 AM This report has been signed electronically. Number of Addenda: 0 Note Initiated On: 04/03/2016 7:51 AM Scope Withdrawal Time: 0 hours 11 minutes 47 seconds  Total Procedure Duration: 0 hours 17 minutes 27 seconds       [facility]

## 2016-04-03 NOTE — H&P (Signed)
Outpatient short stay form Pre-procedure 04/03/2016 7:37 AM Chad Sails MD  Primary Physician: Dr Sherrin Daisy  Reason for visit:  Colonoscopy  History of present illness:  Patient is a 65 year old male presenting today for colonoscopy due to his personal history of adenomatous colon polyps. He presented yesterday for this procedure however the prep was not adequate. He was  to repeat the prep and rescheduled for today. He states he tolerated the prep he is unsure whether it is good. He denies use any aspirin products or blood thinners. He does have a history of atrial fibrillation as well as COPD.    Current facility-administered medications:  .  0.9 %  sodium chloride infusion, , Intravenous, Continuous, Chad Sails, MD  Prescriptions prior to admission  Medication Sig Dispense Refill Last Dose  . albuterol (PROVENTIL HFA;VENTOLIN HFA) 108 (90 Base) MCG/ACT inhaler Inhale 2 puffs into the lungs every 6 (six) hours as needed for wheezing or shortness of breath.   04/03/2016 at 0600  . atorvastatin (LIPITOR) 40 MG tablet Take 40 mg by mouth daily.   04/03/2016 at 0600  . metoprolol succinate (TOPROL-XL) 100 MG 24 hr tablet Take 100 mg by mouth daily. Take with or immediately following a meal.   04/03/2016 at 0600  . albuterol (PROVENTIL) (2.5 MG/3ML) 0.083% nebulizer solution Take 2.5 mg by nebulization every 6 (six) hours as needed for wheezing or shortness of breath.    at time  . aspirin 81 MG tablet Take 81 mg by mouth daily.   03/23/2016  . budesonide-formoterol (SYMBICORT) 160-4.5 MCG/ACT inhaler Inhale 2 puffs into the lungs 2 (two) times daily.     . Multiple Vitamin (MULTIVITAMIN) tablet Take 1 tablet by mouth daily.   Past Week at Unknown time  . nitroGLYCERIN (NITROSTAT) 0.4 MG SL tablet Place 0.4 mg under the tongue every 5 (five) minutes as needed for chest pain.     Marland Kitchen tiotropium (SPIRIVA) 18 MCG inhalation capsule Place 18 mcg into inhaler and inhale daily. Reported on  04/02/2016   Past Week at Unknown time     Allergies  Allergen Reactions  . Chantix [Varenicline]      Past Medical History  Diagnosis Date  . Atrial fibrillation ([place])   . Coronary artery disease   . COPD (chronic obstructive pulmonary disease) ([place])   . Diverticulitis   . ED (erectile dysfunction)   . GERD (gastroesophageal reflux disease)   . Hyperlipidemia   . Hypertension   . PUD (peptic ulcer disease)   . Pulmonary hypertension ([place])   . Colon polyp   . Sleep apnea     noncompliant with CPAP    Review of systems:      Physical Exam    Heart and lungs: Regular rate and rhythm without rub or gallop, lungs are bilaterally clear.    HEENT: Normocephalic atraumatic eyes are anicteric    Other:     Pertinant exam for procedure: Soft protuberant nontender nondistended bowel sounds are positive normoactive.    Planned proceedures: Colonoscopy and indicated procedures. I have discussed the risks benefits and complications of procedures to include not limited to bleeding, infection, perforation and the risk of sedation and the patient wishes to proceed.    Chad Sails, MD Gastroenterology 04/03/2016  7:37 AM

## 2016-04-04 ENCOUNTER — Encounter: Payer: Self-pay | Admitting: Gastroenterology

## 2016-04-23 NOTE — Anesthesia Postprocedure Evaluation (Signed)
Anesthesia Post Note  Patient: Chad Hess  Procedure(s) Performed: Procedure(s) (LRB): COLONOSCOPY WITH PROPOFOL (N/A)  Patient location during evaluation: PACU Anesthesia Type: MAC Level of consciousness: awake and alert Pain management: pain level controlled Vital Signs Assessment: post-procedure vital signs reviewed and stable Respiratory status: spontaneous breathing, nonlabored ventilation, respiratory function stable and patient connected to nasal cannula oxygen Cardiovascular status: stable and blood pressure returned to baseline Anesthetic complications: no    Last Vitals:  Filed Vitals:   04/02/16 1541 04/02/16 1551  BP: 144/96 155/99  Pulse: 94 94  Temp:    Resp: 20 20    Last Pain:  Filed Vitals:   04/03/16 0755  PainSc: 0-No pain                 Waylen Depaolo

## 2016-06-12 LAB — SURGICAL PATHOLOGY

## 2017-07-26 ENCOUNTER — Emergency Department: Payer: Medicare HMO

## 2017-07-26 ENCOUNTER — Encounter: Payer: Self-pay | Admitting: *Deleted

## 2017-07-26 ENCOUNTER — Inpatient Hospital Stay
Admission: EM | Admit: 2017-07-26 | Discharge: 2017-07-29 | DRG: 189 | Disposition: A | Discharge status: Home Health | Payer: Medicare HMO | Attending: Internal Medicine | Admitting: Internal Medicine

## 2017-07-26 DIAGNOSIS — J9811 Atelectasis: Secondary | ICD-10-CM | POA: Diagnosis present

## 2017-07-26 DIAGNOSIS — F17210 Nicotine dependence, cigarettes, uncomplicated: Secondary | ICD-10-CM | POA: Diagnosis present

## 2017-07-26 DIAGNOSIS — G4733 Obstructive sleep apnea (adult) (pediatric): Secondary | ICD-10-CM | POA: Diagnosis present

## 2017-07-26 DIAGNOSIS — Z9119 Patient's noncompliance with other medical treatment and regimen: Secondary | ICD-10-CM | POA: Diagnosis not present

## 2017-07-26 DIAGNOSIS — E669 Obesity, unspecified: Secondary | ICD-10-CM | POA: Diagnosis present

## 2017-07-26 DIAGNOSIS — F419 Anxiety disorder, unspecified: Secondary | ICD-10-CM | POA: Diagnosis present

## 2017-07-26 DIAGNOSIS — I272 Pulmonary hypertension, unspecified: Secondary | ICD-10-CM | POA: Diagnosis present

## 2017-07-26 DIAGNOSIS — Z7982 Long term (current) use of aspirin: Secondary | ICD-10-CM | POA: Diagnosis not present

## 2017-07-26 DIAGNOSIS — G8929 Other chronic pain: Secondary | ICD-10-CM | POA: Diagnosis present

## 2017-07-26 DIAGNOSIS — K219 Gastro-esophageal reflux disease without esophagitis: Secondary | ICD-10-CM | POA: Diagnosis present

## 2017-07-26 DIAGNOSIS — Z6828 Body mass index (BMI) 28.0-28.9, adult: Secondary | ICD-10-CM | POA: Diagnosis not present

## 2017-07-26 DIAGNOSIS — I509 Heart failure, unspecified: Secondary | ICD-10-CM

## 2017-07-26 DIAGNOSIS — J441 Chronic obstructive pulmonary disease with (acute) exacerbation: Secondary | ICD-10-CM | POA: Diagnosis present

## 2017-07-26 DIAGNOSIS — I5023 Acute on chronic systolic (congestive) heart failure: Secondary | ICD-10-CM | POA: Diagnosis present

## 2017-07-26 DIAGNOSIS — I110 Hypertensive heart disease with heart failure: Secondary | ICD-10-CM | POA: Diagnosis present

## 2017-07-26 DIAGNOSIS — Z7951 Long term (current) use of inhaled steroids: Secondary | ICD-10-CM | POA: Diagnosis not present

## 2017-07-26 DIAGNOSIS — Z79899 Other long term (current) drug therapy: Secondary | ICD-10-CM

## 2017-07-26 DIAGNOSIS — Z8711 Personal history of peptic ulcer disease: Secondary | ICD-10-CM

## 2017-07-26 DIAGNOSIS — Z888 Allergy status to other drugs, medicaments and biological substances status: Secondary | ICD-10-CM

## 2017-07-26 DIAGNOSIS — I480 Paroxysmal atrial fibrillation: Secondary | ICD-10-CM | POA: Diagnosis present

## 2017-07-26 DIAGNOSIS — A419 Sepsis, unspecified organism: Secondary | ICD-10-CM

## 2017-07-26 DIAGNOSIS — J9601 Acute respiratory failure with hypoxia: Secondary | ICD-10-CM

## 2017-07-26 DIAGNOSIS — Z86010 Personal history of colonic polyps: Secondary | ICD-10-CM | POA: Diagnosis not present

## 2017-07-26 DIAGNOSIS — I248 Other forms of acute ischemic heart disease: Secondary | ICD-10-CM | POA: Diagnosis present

## 2017-07-26 DIAGNOSIS — E785 Hyperlipidemia, unspecified: Secondary | ICD-10-CM | POA: Diagnosis present

## 2017-07-26 DIAGNOSIS — I2510 Atherosclerotic heart disease of native coronary artery without angina pectoris: Secondary | ICD-10-CM | POA: Diagnosis present

## 2017-07-26 DIAGNOSIS — J9621 Acute and chronic respiratory failure with hypoxia: Secondary | ICD-10-CM | POA: Diagnosis present

## 2017-07-26 DIAGNOSIS — I5021 Acute systolic (congestive) heart failure: Secondary | ICD-10-CM | POA: Diagnosis not present

## 2017-07-26 HISTORY — DX: Heart failure, unspecified: I50.9

## 2017-07-26 LAB — BLOOD GAS, VENOUS
Acid-base deficit: 5.3 mmol/L — ABNORMAL HIGH (ref 0.0–2.0)
Bicarbonate: 22 mmol/L (ref 20.0–28.0)
DELIVERY SYSTEMS: POSITIVE
FIO2: 1
Hi Frequency JET Vent PIP: 16
LHR: 14 {breaths}/min
O2 Saturation: 96.4 %
PATIENT TEMPERATURE: 37
PCO2 VEN: 48 mmHg (ref 44.0–60.0)
PEEP: 8 cmH2O
pH, Ven: 7.27 (ref 7.250–7.430)
pO2, Ven: 96 mmHg — ABNORMAL HIGH (ref 32.0–45.0)

## 2017-07-26 LAB — MAGNESIUM: Magnesium: 1.8 mg/dL (ref 1.7–2.4)

## 2017-07-26 LAB — CBC WITH DIFFERENTIAL/PLATELET
Basophils Absolute: 0.1 10*3/uL (ref 0–0.1)
Basophils Relative: 1 %
Eosinophils Absolute: 0.3 10*3/uL (ref 0–0.7)
Eosinophils Relative: 2 %
HEMATOCRIT: 46.5 % (ref 40.0–52.0)
HEMOGLOBIN: 15.8 g/dL (ref 13.0–18.0)
LYMPHS ABS: 4.4 10*3/uL — AB (ref 1.0–3.6)
LYMPHS PCT: 34 %
MCH: 33.9 pg (ref 26.0–34.0)
MCHC: 34 g/dL (ref 32.0–36.0)
MCV: 99.8 fL (ref 80.0–100.0)
MONOS PCT: 9 %
Monocytes Absolute: 1.1 10*3/uL — ABNORMAL HIGH (ref 0.2–1.0)
NEUTROS PCT: 54 %
Neutro Abs: 7 10*3/uL — ABNORMAL HIGH (ref 1.4–6.5)
Platelets: 235 10*3/uL (ref 150–440)
RBC: 4.65 MIL/uL (ref 4.40–5.90)
RDW: 15.2 % — ABNORMAL HIGH (ref 11.5–14.5)
WBC: 13 10*3/uL — AB (ref 3.8–10.6)

## 2017-07-26 LAB — URINE DRUG SCREEN, QUALITATIVE (ARMC ONLY)
AMPHETAMINES, UR SCREEN: NOT DETECTED
BARBITURATES, UR SCREEN: NOT DETECTED
BENZODIAZEPINE, UR SCRN: NOT DETECTED
CANNABINOID 50 NG, UR ~~LOC~~: NOT DETECTED
Cocaine Metabolite,Ur ~~LOC~~: NOT DETECTED
MDMA (ECSTASY) UR SCREEN: NOT DETECTED
METHADONE SCREEN, URINE: NOT DETECTED
Opiate, Ur Screen: NOT DETECTED
PHENCYCLIDINE (PCP) UR S: NOT DETECTED
Tricyclic, Ur Screen: NOT DETECTED

## 2017-07-26 LAB — BASIC METABOLIC PANEL
ANION GAP: 9 (ref 5–15)
BUN: 17 mg/dL (ref 6–20)
CO2: 24 mmol/L (ref 22–32)
Calcium: 9 mg/dL (ref 8.9–10.3)
Chloride: 104 mmol/L (ref 101–111)
Creatinine, Ser: 1.19 mg/dL (ref 0.61–1.24)
Glucose, Bld: 152 mg/dL — ABNORMAL HIGH (ref 65–99)
Potassium: 4.7 mmol/L (ref 3.5–5.1)
SODIUM: 137 mmol/L (ref 135–145)

## 2017-07-26 LAB — LACTIC ACID, PLASMA
LACTIC ACID, VENOUS: 2.3 mmol/L — AB (ref 0.5–1.9)
LACTIC ACID, VENOUS: 2 mmol/L — AB (ref 0.5–1.9)

## 2017-07-26 LAB — TROPONIN I
TROPONIN I: 0.06 ng/mL — AB (ref <0.03)
TROPONIN I: 0.2 ng/mL — AB (ref <0.03)

## 2017-07-26 LAB — PROCALCITONIN: Procalcitonin: <0.1 ng/mL

## 2017-07-26 LAB — BRAIN NATRIURETIC PEPTIDE: B NATRIURETIC PEPTIDE 5: 256 pg/mL — AB (ref 0.0–100.0)

## 2017-07-26 LAB — PHOSPHORUS: PHOSPHORUS: 6.4 mg/dL — AB (ref 2.5–4.6)

## 2017-07-26 MED ORDER — NITROGLYCERIN IN D5W 200-5 MCG/ML-% IV SOLN: 0.0000 ug/min | Freq: Once | INTRAVENOUS | Status: DC

## 2017-07-26 MED ORDER — FUROSEMIDE 10 MG/ML IJ SOLN
20.0000 mg | Freq: Once | INTRAMUSCULAR | Status: AC
  Administered 2017-07-26: 20 mg via INTRAVENOUS
  Filled 2017-07-26: qty 4

## 2017-07-26 MED ORDER — ALBUTEROL SULFATE (2.5 MG/3ML) 0.083% IN NEBU: 2.5000 mg | INHALATION_SOLUTION | RESPIRATORY_TRACT | Status: DC | PRN

## 2017-07-26 MED ORDER — MOMETASONE FURO-FORMOTEROL FUM 200-5 MCG/ACT IN AERO
2.0000 | INHALATION_SPRAY | Freq: Two times a day (BID) | RESPIRATORY_TRACT | Status: DC
  Administered 2017-07-26 – 2017-07-28 (×5): 2 via RESPIRATORY_TRACT
  Filled 2017-07-26: qty 8.8

## 2017-07-26 MED ORDER — ALPRAZOLAM 0.25 MG PO TABS
0.2500 mg | ORAL_TABLET | Freq: Once | ORAL | Status: AC
  Administered 2017-07-26: 0.25 mg via ORAL
  Filled 2017-07-26: qty 1

## 2017-07-26 MED ORDER — MORPHINE SULFATE (PF) 2 MG/ML IV SOLN: 2.0000 mg | INTRAVENOUS | Status: DC | PRN

## 2017-07-26 MED ORDER — NITROGLYCERIN 0.4 MG SL SUBL: 0.4000 mg | SUBLINGUAL_TABLET | SUBLINGUAL | Status: DC | PRN

## 2017-07-26 MED ORDER — IPRATROPIUM-ALBUTEROL 0.5-2.5 (3) MG/3ML IN SOLN
3.0000 mL | Freq: Once | RESPIRATORY_TRACT | Status: AC
  Administered 2017-07-26: 3 mL via RESPIRATORY_TRACT

## 2017-07-26 MED ORDER — ONDANSETRON HCL 4 MG PO TABS: 4.0000 mg | ORAL_TABLET | Freq: Four times a day (QID) | ORAL | Status: DC | PRN

## 2017-07-26 MED ORDER — METHYLPREDNISOLONE SODIUM SUCC 125 MG IJ SOLR
60.0000 mg | Freq: Two times a day (BID) | INTRAMUSCULAR | Status: DC
  Administered 2017-07-26 – 2017-07-29 (×6): 60 mg via INTRAVENOUS
  Filled 2017-07-26 (×6): qty 2

## 2017-07-26 MED ORDER — METOPROLOL SUCCINATE ER 50 MG PO TB24
100.0000 mg | ORAL_TABLET | Freq: Every day | ORAL | Status: DC
  Administered 2017-07-27 – 2017-07-29 (×3): 100 mg via ORAL
  Filled 2017-07-26 (×3): qty 2

## 2017-07-26 MED ORDER — ACETAMINOPHEN 325 MG PO TABS: 650.0000 mg | ORAL_TABLET | Freq: Four times a day (QID) | ORAL | Status: DC | PRN

## 2017-07-26 MED ORDER — IPRATROPIUM-ALBUTEROL 0.5-2.5 (3) MG/3ML IN SOLN
3.0000 mL | RESPIRATORY_TRACT | Status: DC
  Administered 2017-07-26 – 2017-07-29 (×16): 3 mL via RESPIRATORY_TRACT
  Filled 2017-07-26 (×16): qty 3

## 2017-07-26 MED ORDER — ASPIRIN EC 81 MG PO TBEC
81.0000 mg | DELAYED_RELEASE_TABLET | Freq: Every day | ORAL | Status: DC
  Administered 2017-07-27 – 2017-07-29 (×3): 81 mg via ORAL
  Filled 2017-07-26 (×3): qty 1

## 2017-07-26 MED ORDER — POLYETHYLENE GLYCOL 3350 17 G PO PACK: 17.0000 g | PACK | Freq: Every day | ORAL | Status: DC | PRN

## 2017-07-26 MED ORDER — ENOXAPARIN SODIUM 40 MG/0.4ML ~~LOC~~ SOLN
40.0000 mg | SUBCUTANEOUS | Status: DC
  Administered 2017-07-26 – 2017-07-28 (×3): 40 mg via SUBCUTANEOUS
  Filled 2017-07-26 (×3): qty 0.4

## 2017-07-26 MED ORDER — METHYLPREDNISOLONE SODIUM SUCC 125 MG IJ SOLR
125.0000 mg | Freq: Once | INTRAMUSCULAR | Status: AC
  Administered 2017-07-26: 125 mg via INTRAVENOUS

## 2017-07-26 MED ORDER — TRAMADOL HCL 50 MG PO TABS: 50.0000 mg | ORAL_TABLET | Freq: Four times a day (QID) | ORAL | Status: DC | PRN

## 2017-07-26 MED ORDER — DEXTROSE 5 % IV SOLN
500.0000 mg | Freq: Once | INTRAVENOUS | Status: AC
  Administered 2017-07-26: 500 mg via INTRAVENOUS
  Filled 2017-07-26: qty 500

## 2017-07-26 MED ORDER — ONDANSETRON HCL 4 MG/2ML IJ SOLN: 4.0000 mg | Freq: Four times a day (QID) | INTRAMUSCULAR | Status: DC | PRN

## 2017-07-26 MED ORDER — SODIUM CHLORIDE 0.9 % IV SOLN: 250.0000 mL | INTRAVENOUS | Status: DC | PRN

## 2017-07-26 MED ORDER — FUROSEMIDE 10 MG/ML IJ SOLN
40.0000 mg | Freq: Two times a day (BID) | INTRAMUSCULAR | Status: DC
  Administered 2017-07-26 – 2017-07-28 (×5): 40 mg via INTRAVENOUS
  Filled 2017-07-26 (×5): qty 4

## 2017-07-26 MED ORDER — TIOTROPIUM BROMIDE MONOHYDRATE 18 MCG IN CAPS
18.0000 ug | ORAL_CAPSULE | Freq: Every day | RESPIRATORY_TRACT | Status: DC
  Administered 2017-07-27 – 2017-07-28 (×2): 18 ug via RESPIRATORY_TRACT
  Filled 2017-07-26: qty 5

## 2017-07-26 MED ORDER — SODIUM CHLORIDE 0.9% FLUSH
3.0000 mL | INTRAVENOUS | Status: DC | PRN
  Administered 2017-07-27: 3 mL via INTRAVENOUS
  Filled 2017-07-26: qty 3

## 2017-07-26 MED ORDER — NICOTINE 21 MG/24HR TD PT24
21.0000 mg | MEDICATED_PATCH | Freq: Every day | TRANSDERMAL | Status: DC
  Administered 2017-07-26 – 2017-07-29 (×4): 21 mg via TRANSDERMAL
  Filled 2017-07-26 (×4): qty 1

## 2017-07-26 MED ORDER — ATORVASTATIN CALCIUM 20 MG PO TABS
40.0000 mg | ORAL_TABLET | Freq: Every day | ORAL | Status: DC
  Administered 2017-07-27 – 2017-07-29 (×3): 40 mg via ORAL
  Filled 2017-07-26 (×3): qty 2

## 2017-07-26 MED ORDER — DEXTROSE 5 % IV SOLN
1.0000 g | Freq: Once | INTRAVENOUS | Status: AC
  Administered 2017-07-26: 1 g via INTRAVENOUS
  Filled 2017-07-26: qty 10

## 2017-07-26 MED ORDER — LORAZEPAM 2 MG/ML IJ SOLN: 0.5000 mg | Freq: Once | INTRAMUSCULAR | Status: DC

## 2017-07-26 MED ORDER — ACETAMINOPHEN 650 MG RE SUPP: 650.0000 mg | Freq: Four times a day (QID) | RECTAL | Status: DC | PRN

## 2017-07-26 MED ORDER — SODIUM CHLORIDE 0.9% FLUSH
3.0000 mL | Freq: Two times a day (BID) | INTRAVENOUS | Status: DC
  Administered 2017-07-26 – 2017-07-28 (×5): 3 mL via INTRAVENOUS

## 2017-07-26 NOTE — ED Notes (Signed)
Dr. Alfred Levins aware of lactic acid of 2.3

## 2017-07-26 NOTE — ED Notes (Signed)
Dr. Alfred Levins informed of Troponin of 0.06

## 2017-07-26 NOTE — H&P (Signed)
[place] [facility] [place] NAME: Chad Hess    MR#:  [id number]  DATE OF BIRTH:  06-Jul-1951  DATE OF ADMISSION:  07/26/2017  PRIMARY CARE PHYSICIAN: Sherrin Daisy, MD   REQUESTING/REFERRING PHYSICIAN: Dr. Alfred Levins  CHIEF COMPLAINT:   Chief Complaint  Patient presents with  . Shortness of Breath  . Chest Pain    HISTORY OF PRESENT ILLNESS:  Chad Hess  is a 66 y.o. male with a known history of COPD, systolic CHF, home oxygen, atrial fibrillation, hypertension, tobacco abuse presents to the emergency room complaining of shortness of breath of one day. Patient has had wheezing. When EMS arrived he was saturating in the 4s. Was placed on CPAP. Addition to BiPAP in the emergency room. Continues to have tachypnea. Feels some improvement. Chest x-ray shows mild edema. He had moderate chest pain at home which she has chronically on and off. Troponin is 0.06.  PAST MEDICAL HISTORY:   Past Medical History:  Diagnosis Date  . Atrial fibrillation ([place])   . CHF (congestive heart failure) ([place])   . Colon polyp   . COPD (chronic obstructive pulmonary disease) ([place])   . Coronary artery disease   . Diverticulitis   . ED (erectile dysfunction)   . GERD (gastroesophageal reflux disease)   . Hyperlipidemia   . Hypertension   . PUD (peptic ulcer disease)   . Pulmonary hypertension ([place])   . Sleep apnea    noncompliant with CPAP    PAST SURGICAL HISTORY:   Past Surgical History:  Procedure Laterality Date  . COLONOSCOPY  12/07/2004  . COLONOSCOPY N/A 04/03/2016   Procedure: COLONOSCOPY;  Surgeon: Lollie Sails, MD;  Location: [facility] ENDOSCOPY;  Service: Endoscopy;  Laterality: N/A;  . COLONOSCOPY WITH PROPOFOL N/A 02/20/2016   Procedure: COLONOSCOPY WITH PROPOFOL;  Surgeon: Lollie Sails, MD;  Location: [facility] ENDOSCOPY;  Service: Endoscopy;  Laterality: N/A;  . COLONOSCOPY WITH PROPOFOL N/A 04/02/2016   Procedure: COLONOSCOPY WITH PROPOFOL;   Surgeon: Lollie Sails, MD;  Location: [facility] ENDOSCOPY;  Service: Endoscopy;  Laterality: N/A;  . HERNIA REPAIR Right     SOCIAL HISTORY:   Social History  Substance Use Topics  . Smoking status: Current Every Day Smoker    Packs/day: 1.00    Years: 50.00  . Smokeless tobacco: Never Used  . Alcohol use Yes    FAMILY HISTORY:   Family History  Problem Relation Age of Onset  . Sudden death Mother     DRUG ALLERGIES:   Allergies  Allergen Reactions  . Chantix [Varenicline]     REVIEW OF SYSTEMS:   Review of Systems  Constitutional: Positive for malaise/fatigue. Negative for chills and fever.  HENT: Negative for sore throat.   Eyes: Negative for blurred vision, double vision and pain.  Respiratory: Positive for cough, shortness of breath and wheezing. Negative for hemoptysis.   Cardiovascular: Positive for chest pain. Negative for palpitations, orthopnea and leg swelling.  Gastrointestinal: Negative for abdominal pain, constipation, diarrhea, heartburn, nausea and vomiting.  Genitourinary: Negative for dysuria and hematuria.  Musculoskeletal: Negative for back pain and joint pain.  Skin: Negative for rash.  Neurological: Positive for weakness. Negative for sensory change, speech change, focal weakness and headaches.  Endo/Heme/Allergies: Does not bruise/bleed easily.  Psychiatric/Behavioral: Negative for depression. The patient is not nervous/anxious.     MEDICATIONS AT HOME:   Prior to Admission medications   Medication Sig Start Date End Date Taking? Authorizing Provider  albuterol (  PROVENTIL HFA;VENTOLIN HFA) 108 (90 Base) MCG/ACT inhaler Inhale 2 puffs into the lungs every 6 (six) hours as needed for wheezing or shortness of breath.    [provider]  albuterol (PROVENTIL) (2.5 MG/3ML) 0.083% nebulizer solution Take 2.5 mg by nebulization every 6 (six) hours as needed for wheezing or shortness of breath.    [provider]  aspirin 81 MG  tablet Take 81 mg by mouth daily.    [provider]  atorvastatin (LIPITOR) 40 MG tablet Take 40 mg by mouth daily.    [provider]  budesonide-formoterol (SYMBICORT) 160-4.5 MCG/ACT inhaler Inhale 2 puffs into the lungs 2 (two) times daily.    [provider]  metoprolol succinate (TOPROL-XL) 100 MG 24 hr tablet Take 100 mg by mouth daily. Take with or immediately following a meal.    [provider]  Multiple Vitamin (MULTIVITAMIN) tablet Take 1 tablet by mouth daily.    [provider]  nitroGLYCERIN (NITROSTAT) 0.4 MG SL tablet Place 0.4 mg under the tongue every 5 (five) minutes as needed for chest pain.    [provider]  tiotropium (SPIRIVA) 18 MCG inhalation capsule Place 18 mcg into inhaler and inhale daily. Reported on 04/02/2016    [provider]     VITAL SIGNS:  Blood pressure 106/79, pulse 88, temperature 97.7 F (36.5 C), temperature source Axillary, resp. rate (!) 22, height 5\' 7"  (1.702 m), weight 81.2 kg (179 lb), SpO2 98 %.  PHYSICAL EXAMINATION:  Physical Exam  GENERAL:  66 y.o.-year-old patient lying in the bed, looks critically ill EYES: Pupils equal, round, reactive to light and accommodation. No scleral icterus. Extraocular muscles intact.  HEENT: Head atraumatic, normocephalic. Oropharynx and nasopharynx clear. No oropharyngeal erythema, moist oral mucosa  NECK:  Supple, no jugular venous distention. No thyroid enlargement, no tenderness.  LUNGS: poor air entry bilaterally with expiratory wheezing  CARDIOVASCULAR: S1, S2 normal. No murmurs, rubs, or gallops.  ABDOMEN: Soft, nontender, nondistended. Bowel sounds present. No organomegaly or mass.  EXTREMITIES: No pedal edema, cyanosis, or clubbing. + 2 pedal & radial pulses b/l.   NEUROLOGIC: Cranial nerves II through XII are intact. No focal Motor or sensory deficits appreciated b/l PSYCHIATRIC: The patient is alert and oriented x 3. Good affect.   SKIN: No obvious rash, lesion, or ulcer.   LABORATORY PANEL:   CBC  Recent Labs Lab 07/26/17 1513  WBC 13.0*  HGB 15.8  HCT 46.5  PLT 235   ------------------------------------------------------------------------------------------------------------------  Chemistries   Recent Labs Lab 07/26/17 1548  NA 137  K 4.7  CL 104  CO2 24  GLUCOSE 152*  BUN 17  CREATININE 1.19  CALCIUM 9.0   ------------------------------------------------------------------------------------------------------------------  Cardiac Enzymes  Recent Labs Lab 07/26/17 1548  TROPONINI 0.06*   ------------------------------------------------------------------------------------------------------------------  RADIOLOGY:  Dg Chest Portable 1 View  Result Date: 07/26/2017 CLINICAL DATA:  Shortness of breath EXAM: PORTABLE CHEST 1 VIEW COMPARISON:  None. FINDINGS: Mild cardiomegaly. Mediastinal shadows are normal. Suspicion of early interstitial edema. No infiltrate, collapse or effusion. No significant bone finding. IMPRESSION: Cardiomegaly.  Suspicion of early interstitial edema. Electronically Signed   By: [name] M.D.   On: 07/26/2017 15:32     IMPRESSION AND PLAN:   * acute on chronic hypoxic respiratory failure due to COPD exacerbation and CHF exacerbation We'll treat with IV steroids, bronchodilators, BiPAP support, IV Lasix. Elevated lactic acid likely due to his CHF. No IV fluids due to fluid overload. Check BNP. Check  pro calcitonin. Patient received IV antibiotics in the emergency room. We'll continue Levaquin for his COPD. Discussed with intensivist Dr. Madalyn Rob with [facility].  * mild elevation in troponin of 0.06 likely demand ischemia from his respiratory issue and CHF. No EKG changes. We'll repeat troponin. On aspirin. There is a significant increase will need heparin drip and cardiology consultation.  * paroxysmal atrial fibrillation. Continue rate control medications and  aspirin.  * DVT prophylaxis with Lovenox  All the records are reviewed and case discussed with ED provider. Management plans discussed with the patient, family and they are in agreement.  CODE STATUS:  FULL CODE  TOTAL CC TIME TAKING CARE OF THIS PATIENT: 40 minutes.   Hillary Bow R M.D on 07/26/2017 at 4:33 PM  Between 7am to 6pm - Pager - [phone]  After 6pm go to [email or web address] - password [email or web address] [email or web address]  Office  [phone]  CC: Primary care physician; Sherrin Daisy, MD  Note: This dictation was prepared with Dragon dictation along with smaller phrase technology. Any transcriptional errors that result from this process are unintentional.

## 2017-07-26 NOTE — ED Triage Notes (Signed)
Per EMS report, patient c/o increasing shortness of breath since this morning. Patient has a history of copd and CHF. Patient states he is on O2 at home, but is unaware of the rate. Per EMS report, patient pulse ox was in the 70's on room air, 80's on a non-rebreather  And in the 90's on Cpap. Patient was given 2 sprays of nitro SL and one inch of nitro paste applied to the left chest while enroute for c/o chest pressure.

## 2017-07-26 NOTE — ED Notes (Signed)
Nitro patch on left chest that was placed by EMS was removed by Dr. Alfred Levins.

## 2017-07-26 NOTE — Consult Note (Signed)
PULMONARY / CRITICAL CARE MEDICINE   Name: Chad Hess MRN: [id number] DOB: 12-Mar-1951    ADMISSION DATE:  07/26/2017   CONSULTATION DATE:  07/26/2017  REFERRING MD:  Dr Vianne Bulls  REASON: Acute hypoxemic respiratory failure  CHIEF COMPLAINT:  Acute dyspnea  HISTORY OF PRESENT ILLNESS:  This is a 66 y/o WM with a PMH as indicated below, on home CPAP but non-complaint with use who presented to the ED via EMS with worsening dyspnea. Patient states that he started feeling short of breath  This morning and symptoms got worse this afternoon hence he decided to call EMS. Upon EMS' arrival, patient was hypoxic on RA with an SPO2 of 70%. He was placed on CPAP and transferred to the ED. At the ED, he was switched to BiPAP. Patient received IV and inhaled steroids. He is now off BiPAP in High flow [place]. He reports significant improvement in dyspnea. Offers no complaints His cardiologist is Dr. Ubaldo Glassing and his pulmonologist is Dr. Raul Del. His last echo was on 02/28/2016.   PAST MEDICAL HISTORY :  He  has a past medical history of Atrial fibrillation ([place]); CHF (congestive heart failure) ([place]); Colon polyp; COPD (chronic obstructive pulmonary disease) ([place]); Coronary artery disease; Diverticulitis; ED (erectile dysfunction); GERD (gastroesophageal reflux disease); Hyperlipidemia; Hypertension; PUD (peptic ulcer disease); Pulmonary hypertension ([place]); and Sleep apnea.  PAST SURGICAL HISTORY: He  has a past surgical history that includes Hernia repair ([place]); Colonoscopy (12/07/2004); Colonoscopy with propofol (N/A, 02/20/2016); Colonoscopy (N/A, 04/03/2016); and Colonoscopy with propofol (N/A, 04/02/2016).  Allergies  Allergen Reactions  . Chantix [Varenicline]     No current facility-administered medications on file prior to encounter.    Current Outpatient Prescriptions on File Prior to Encounter  Medication Sig  . aspirin 81 MG tablet Take 81 mg by mouth daily.  Marland Kitchen atorvastatin (LIPITOR) 40 MG tablet  Take 40 mg by mouth daily.  . budesonide-formoterol (SYMBICORT) 160-4.5 MCG/ACT inhaler Inhale 2 puffs into the lungs 2 (two) times daily.  . metoprolol succinate (TOPROL-XL) 100 MG 24 hr tablet Take 200 mg by mouth daily. Take with or immediately following a meal.   . Multiple Vitamin (MULTIVITAMIN) tablet Take 1 tablet by mouth daily.  Marland Kitchen albuterol (PROVENTIL HFA;VENTOLIN HFA) 108 (90 Base) MCG/ACT inhaler Inhale 2 puffs into the lungs every 4 (four) hours as needed for wheezing or shortness of breath.   Marland Kitchen albuterol (PROVENTIL) (2.5 MG/3ML) 0.083% nebulizer solution Take 2.5 mg by nebulization daily.   . nitroGLYCERIN (NITROSTAT) 0.4 MG SL tablet Place 0.4 mg under the tongue every 5 (five) minutes as needed for chest pain.    FAMILY HISTORY:  His indicated that the status of his mother is unknown.    SOCIAL HISTORY: He  reports that he has been smoking.  He has a 50.00 pack-year smoking history. He has never used smokeless tobacco. He reports that he drinks alcohol. He reports that he does not use drugs.  REVIEW OF SYSTEMS:   Constitutional: Negative for fever and chills but positive for generalized malaise.  HENT: Negative for congestion and rhinorrhea.  Eyes: Negative for redness and visual disturbance.  Respiratory: positive for shortness of breath and wheezing.  Cardiovascular: Negative for chest pain and palpitations.  Gastrointestinal: Negative  for nausea , vomiting and abdominal pain and  Loose stools Genitourinary: Negative for dysuria and urgency.  Endocrine: Denies polyuria, polyphagia and heat intolerance Musculoskeletal: Negative for myalgias and arthralgias.  Skin: Negative for pallor and wound.  Neurological: Negative for dizziness  and headaches   SUBJECTIVE:  VITAL SIGNS: BP 111/88   Pulse 93   Temp 97.7 F (36.5 C) (Axillary)   Resp (!) 26   Ht 5\' 7"  (1.702 m)   Wt 179 lb (81.2 kg)   SpO2 98%   BMI 28.04 kg/m   HEMODYNAMICS:    VENTILATOR  SETTINGS: FiO2 (%):  [35 %] 35 %  INTAKE / OUTPUT: I/O last 3 completed shifts: In: 300 [IV Piggyback:300] Out: 200 [Urine:200]  PHYSICAL EXAMINATION: General: lying in bed, NAD Neuro:  AAO X4, no focal deficits HEENT: PERRLA, oral mucosa pink, trachea midline Cardiovascular: AP irregular, S1/S2, +2 pulses, +2 edema. Lungs:  Mildly increased wob, breath sounds diminished in all lung fields, mild expiratory wheezes, no rhonchi.  Abdomen:  Obese, normal bowel sounds, no organomegaly Musculoskeletal:  +rom, No deformities Skin:  Warm and dry  LABS:  BMET  Recent Labs Lab 07/26/17 1548  NA 137  K 4.7  CL 104  CO2 24  BUN 17  CREATININE 1.19  GLUCOSE 152*    Electrolytes  Recent Labs Lab 07/26/17 1548  CALCIUM 9.0    CBC  Recent Labs Lab 07/26/17 1513  WBC 13.0*  HGB 15.8  HCT 46.5  PLT 235    Coag's No results for input(s): APTT, INR in the last 168 hours.  Sepsis Markers  Recent Labs Lab 07/26/17 1513 07/26/17 1621 07/26/17 1933  LATICACIDVEN 2.3*  --  2.0*  PROCALCITON  --  <0.10  --     ABG No results for input(s): PHART, PCO2ART, PO2ART in the last 168 hours.  Liver Enzymes No results for input(s): AST, ALT, ALKPHOS, BILITOT, ALBUMIN in the last 168 hours.  Cardiac Enzymes  Recent Labs Lab 07/26/17 1548 07/26/17 1933  TROPONINI 0.06* 0.20*    Glucose No results for input(s): GLUCAP in the last 168 hours.  Imaging Dg Chest Portable 1 View  Result Date: 07/26/2017 CLINICAL DATA:  Shortness of breath EXAM: PORTABLE CHEST 1 VIEW COMPARISON:  None. FINDINGS: Mild cardiomegaly. Mediastinal shadows are normal. Suspicion of early interstitial edema. No infiltrate, collapse or effusion. No significant bone finding. IMPRESSION: Cardiomegaly.  Suspicion of early interstitial edema. Electronically Signed   By: [name] M.D.   On: 07/26/2017 15:32    STUDIES:  2 D echo pending  CULTURES: None  ANTIBIOTICS: None  SIGNIFICANT  EVENTS: 07/26/17>Admitted  LINES/TUBES: PIVs  DISCUSSION: This is a 66 y/o male presenting with acute hypoxemic respiratory failure, pulmonary edema and acute CHF exacerbation  ASSESSMENT  Acute hypoxic respiratory failure Acute CHF exacerbation Acute on chronic COPD exacerbation Chest pain History of: Atrial fibrillation, Pulmonary hypertension, hypertension, CAD and OSA PLAN IV diuresis IV steroids Nebulized bronchodilator and steriods Hemodynamics per ICU PROTOCOL Urine tox Monitor and replace electrolytes Supplemental O2 via High flow [place] and titrate off as tolerated 2- D echo Cycle cardiac enzymes Chest X-ray prn Resume home BP medications GI/DVT prophylaxis  Further changes in treatment plan pending clinical course and diagnostics  FAMILY  - Updates: No family at bedside. Patient updated on current treatment plan and expresses understanding    - Inter-disciplinary family meet or Palliative Care meeting due by:  day [place]. [facility] ANP-BC Pulmonary and Critical Care Medicine [facility] Pager [phone] or [phone]  07/26/2017, 9:46 PM

## 2017-07-26 NOTE — ED Provider Notes (Signed)
[facility] Emergency Department Provider Note  ____________________________________________  Time seen: Approximately 4:17 PM  I have reviewed the triage vital signs and the nursing notes.   HISTORY  Chief Complaint Shortness of Breath and Chest Pain   HPI Chad Hess is a 66 y.o. male with a history of atrial fibrillation, COPD, CHF, coronary artery disease who presents for evaluation of shortness of breath. Patient reports that he woke up short of breath this morning and is gotten progressively worse throughout the day. He is complaining of severe shortness of breath associated with wheezing and central constant non radiating her vital chest tightness. He reports worsening baseline cough, no fever or chills, no vomiting or diarrhea. He is compliant with his medications. He is not on a diuretic.patient hypoxic to the 80s and hypertensive to 170s per EMS on arrival, started on bipap and nitro.  Past Medical History:  Diagnosis Date  . Atrial fibrillation ([place])   . Colon polyp   . COPD (chronic obstructive pulmonary disease) ([place])   . Coronary artery disease   . Diverticulitis   . ED (erectile dysfunction)   . GERD (gastroesophageal reflux disease)   . Hyperlipidemia   . Hypertension   . PUD (peptic ulcer disease)   . Pulmonary hypertension ([place])   . Sleep apnea    noncompliant with CPAP    There are no active problems to display for this patient.   Past Surgical History:  Procedure Laterality Date  . COLONOSCOPY  12/07/2004  . COLONOSCOPY N/A 04/03/2016   Procedure: COLONOSCOPY;  Surgeon: Lollie Sails, MD;  Location: [facility] ENDOSCOPY;  Service: Endoscopy;  Laterality: N/A;  . COLONOSCOPY WITH PROPOFOL N/A 02/20/2016   Procedure: COLONOSCOPY WITH PROPOFOL;  Surgeon: Lollie Sails, MD;  Location: [facility] ENDOSCOPY;  Service: Endoscopy;  Laterality: N/A;  . COLONOSCOPY WITH PROPOFOL N/A 04/02/2016   Procedure: COLONOSCOPY WITH PROPOFOL;   Surgeon: Lollie Sails, MD;  Location: [facility] ENDOSCOPY;  Service: Endoscopy;  Laterality: N/A;  . HERNIA REPAIR [place]     Prior to Admission medications   Medication Sig Start Date End Date Taking? Authorizing Provider  albuterol (PROVENTIL HFA;VENTOLIN HFA) 108 (90 Base) MCG/ACT inhaler Inhale 2 puffs into the lungs every 6 (six) hours as needed for wheezing or shortness of breath.    [provider]  albuterol (PROVENTIL) (2.5 MG/3ML) 0.083% nebulizer solution Take 2.5 mg by nebulization every 6 (six) hours as needed for wheezing or shortness of breath.    [provider]  aspirin 81 MG tablet Take 81 mg by mouth daily.    [provider]  atorvastatin (LIPITOR) 40 MG tablet Take 40 mg by mouth daily.    [provider]  budesonide-formoterol (SYMBICORT) 160-4.5 MCG/ACT inhaler Inhale 2 puffs into the lungs 2 (two) times daily.    [provider]  metoprolol succinate (TOPROL-XL) 100 MG 24 hr tablet Take 100 mg by mouth daily. Take with or immediately following a meal.    [provider]  Multiple Vitamin (MULTIVITAMIN) tablet Take 1 tablet by mouth daily.    [provider]  nitroGLYCERIN (NITROSTAT) 0.4 MG SL tablet Place 0.4 mg under the tongue every 5 (five) minutes as needed for chest pain.    [provider]  tiotropium (SPIRIVA) 18 MCG inhalation capsule Place 18 mcg into inhaler and inhale daily. Reported on 04/02/2016    [provider]    Allergies Chantix [varenicline]  No family history on file.  Social History Social History  Substance Use Topics  . Smoking status: Current Every Day Smoker    Packs/day: 1.00    Years: 50.00  . Smokeless tobacco: Never Used  . Alcohol use Yes    Review of Systems  Constitutional: Negative for fever. Eyes: Negative for visual changes. ENT: Negative for sore throat. Neck: No neck pain  Cardiovascular: Negative for chest pain. Respiratory: + shortness  of breath, wheezing and cough Gastrointestinal: Negative for abdominal pain, vomiting or diarrhea. Genitourinary: Negative for dysuria. Musculoskeletal: Negative for back pain. Skin: Negative for rash. Neurological: Negative for headaches, weakness or numbness. Psych: No SI or HI  ____________________________________________   PHYSICAL EXAM:  VITAL SIGNS: ED Triage Vitals  Enc Vitals Group     BP 07/26/17 1521 (!) 134/99     Pulse Rate 07/26/17 1510 (!) 103     Resp 07/26/17 1521 (!) 22     Temp 07/26/17 1543 97.7 F (36.5 C)     Temp Source 07/26/17 1543 Axillary     SpO2 07/26/17 1510 98 %     Weight 07/26/17 1531 179 lb (81.2 kg)     Height 07/26/17 1531 5\' 7"  (1.702 m)     Head Circumference --      Peak Flow --      Pain Score 07/26/17 1521 3     Pain Loc --      Pain Edu? --      Excl. in [place]? --     Constitutional: Alert and oriented, respiratory distress.  HEENT:      Head: Normocephalic and atraumatic.         Eyes: Conjunctivae are normal. Sclera is non-icteric.       Mouth/Throat: Mucous membranes are moist.       Neck: Supple with no signs of meningismus. Cardiovascular: irregularly irregular rhythm with tachycardic rate. No murmurs, gallops, or rubs. 2+ symmetrical distal pulses are present in all extremities. No JVD. Respiratory: moderate to severe respiratory distress, significantly increased work of breathing, diffuse wheezing, decreased air movement Gastrointestinal: Soft, non tender, and non distended with positive bowel sounds. No rebound or guarding. Musculoskeletal: Nontender with normal range of motion in all extremities. No edema, cyanosis, or erythema of extremities. Neurologic: Normal speech and language. Face is symmetric. Moving all extremities. No gross focal neurologic deficits are appreciated. Skin: Skin is warm, dry and intact. No rash noted. Psychiatric: Mood and affect are normal. Speech and behavior are  normal.  ____________________________________________   LABS (all labs ordered are listed, but only abnormal results are displayed)  Labs Reviewed  CBC WITH DIFFERENTIAL/PLATELET - Abnormal; Notable for the following:       Result Value   WBC 13.0 (*)    RDW 15.2 (*)    Neutro Abs 7.0 (*)    Lymphs Abs 4.4 (*)    Monocytes Absolute 1.1 (*)    All other components within normal limits  BLOOD GAS, VENOUS - Abnormal; Notable for the following:    pO2, Ven 96.0 (*)    Acid-base deficit 5.3 (*)    All other components within normal limits  LACTIC ACID, PLASMA - Abnormal; Notable for the following:    Lactic Acid, Venous 2.3 (*)    All other components within normal limits  BASIC METABOLIC PANEL  TROPONIN I  BRAIN NATRIURETIC PEPTIDE  LACTIC ACID, PLASMA  PROCALCITONIN  TROPONIN I  TROPONIN I   ____________________________________________  EKG  ED ECG REPORT I, Rudene Re, the attending  physician, personally viewed and interpreted this ECG.  Atrial fibrillation, rate of 105, normal QTC, normal axis, no ST elevations or depressions.  ____________________________________________  RADIOLOGY  CXR:  Cardiomegaly. Suspicion of early interstitial edema ____________________________________________   PROCEDURES  Procedure(s) performed: None Procedures Critical Care performed: yes  CRITICAL CARE Performed by: Rudene Re  ?  Total critical care time: 40 min  Critical care time was exclusive of separately billable procedures and treating other patients.  Critical care was necessary to treat or prevent imminent or life-threatening deterioration.  Critical care was time spent personally by me on the following activities: development of treatment plan with patient and/or surrogate as well as nursing, discussions with consultants, evaluation of patient's response to treatment, examination of patient, obtaining history from patient or surrogate, ordering and  performing treatments and interventions, ordering and review of laboratory studies, ordering and review of radiographic studies, pulse oximetry and re-evaluation of patient's condition.  ____________________________________________   INITIAL IMPRESSION / ASSESSMENT AND PLAN / ED COURSE  66 y.o. male with a history of atrial fibrillation, COPD, CHF, coronary artery disease who presents for evaluation of shortness of breath.patient arrives in moderate to severe respiratory distress, hypoxic on room air. He was placed on BiPAP, patient had significantly decreased air movement and diffuse wheezes. Patient looks euvolemic. He was severely hypertensive however that improved with nitroglycerin paste and was given per EMS. Patient was started on DuoNeb 3 and Solu-Medrol. He was given Rocephin and azithromycin for leukocytosis, elevated lactic, and tachycardia concerning for sepsis. Chest x-ray concerning for cardiomegaly and early pulmonary edema and therefore patient was given 20 mg of IV Lasix. Patient to be admitted to the hospitalist service.     Pertinent labs & imaging results that were available during my care of the patient were reviewed by me and considered in my medical decision making (see chart for details).    ____________________________________________   FINAL CLINICAL IMPRESSION(S) / ED DIAGNOSES  Final diagnoses:  Acute respiratory failure with hypoxia (HCC)  COPD exacerbation (HCC)  Sepsis, due to unspecified organism ([place])  Acute on chronic congestive heart failure, unspecified heart failure type ([place])      NEW MEDICATIONS STARTED DURING THIS VISIT:  New Prescriptions   No medications on file     Note:  This document was prepared using Dragon voice recognition software and may include unintentional dictation errors.    Alfred Levins, [place], MD 07/26/17 [id number]

## 2017-07-26 NOTE — Progress Notes (Signed)
eLink Physician-Brief Progress Note Patient Name: Chad Hess DOB: 1951/10/30 MRN: [id number]   Date of Service  07/26/2017  HPI/Events of Note  Anxiety - Patient is on BiPAP.   eICU Interventions  Will order: 1. Xanax 0.25 mg PO now.      Intervention Category Minor Interventions: Agitation / anxiety - evaluation and management  [name] 07/26/2017, 7:22 PM

## 2017-07-26 NOTE — ED Notes (Signed)
Nitro SL and Nitro drip held at this time per Dr. Alfred Levins.

## 2017-07-26 NOTE — ED Notes (Signed)
Unable to call report to ICU at this time due to RN not being available.

## 2017-07-27 ENCOUNTER — Inpatient Hospital Stay
Admit: 2017-07-27 | Discharge: 2017-07-27 | Disposition: A | Discharge status: Home / Self Care | Payer: Medicare HMO | Attending: Adult Health | Admitting: Adult Health

## 2017-07-27 DIAGNOSIS — J441 Chronic obstructive pulmonary disease with (acute) exacerbation: Secondary | ICD-10-CM

## 2017-07-27 DIAGNOSIS — I5021 Acute systolic (congestive) heart failure: Secondary | ICD-10-CM

## 2017-07-27 LAB — PROCALCITONIN: Procalcitonin: 0.29 ng/mL

## 2017-07-27 LAB — BASIC METABOLIC PANEL
Anion gap: 11 (ref 5–15)
BUN: 21 mg/dL — ABNORMAL HIGH (ref 6–20)
CALCIUM: 8.7 mg/dL — AB (ref 8.9–10.3)
CO2: 23 mmol/L (ref 22–32)
CREATININE: 1.26 mg/dL — AB (ref 0.61–1.24)
Chloride: 101 mmol/L (ref 101–111)
GFR, EST NON AFRICAN AMERICAN: 58 mL/min — AB (ref >60)
Glucose, Bld: 168 mg/dL — ABNORMAL HIGH (ref 65–99)
Potassium: 4.2 mmol/L (ref 3.5–5.1)
SODIUM: 135 mmol/L (ref 135–145)

## 2017-07-27 LAB — CBC
HCT: 43.1 % (ref 40.0–52.0)
Hemoglobin: 15.2 g/dL (ref 13.0–18.0)
MCH: 34.8 pg — ABNORMAL HIGH (ref 26.0–34.0)
MCHC: 35.3 g/dL (ref 32.0–36.0)
MCV: 98.6 fL (ref 80.0–100.0)
PLATELETS: 198 10*3/uL (ref 150–440)
RBC: 4.37 MIL/uL — AB (ref 4.40–5.90)
RDW: 15.1 % — ABNORMAL HIGH (ref 11.5–14.5)
WBC: 10 10*3/uL (ref 3.8–10.6)

## 2017-07-27 LAB — MRSA PCR SCREENING: MRSA by PCR: NEGATIVE

## 2017-07-27 LAB — TROPONIN I: TROPONIN I: 0.27 ng/mL — AB (ref <0.03)

## 2017-07-27 MED ORDER — ORAL CARE MOUTH RINSE
15.0000 mL | Freq: Two times a day (BID) | OROMUCOSAL | Status: DC
  Administered 2017-07-27 – 2017-07-28 (×3): 15 mL via OROMUCOSAL

## 2017-07-27 MED ORDER — RAMELTEON 8 MG PO TABS
8.0000 mg | ORAL_TABLET | Freq: Every evening | ORAL | Status: DC | PRN
  Administered 2017-07-27 – 2017-07-28 (×2): 8 mg via ORAL
  Filled 2017-07-27 (×3): qty 1

## 2017-07-27 MED ORDER — ALUM & MAG HYDROXIDE-SIMETH 200-200-20 MG/5ML PO SUSP
30.0000 mL | ORAL | Status: DC | PRN
  Administered 2017-07-27: 20:00:00 30 mL via ORAL
  Filled 2017-07-27: qty 30

## 2017-07-27 MED ORDER — PERFLUTREN LIPID MICROSPHERE
1.0000 mL | INTRAVENOUS | Status: AC | PRN
  Administered 2017-07-27: 8 mL via INTRAVENOUS
  Filled 2017-07-27: qty 10

## 2017-07-27 NOTE — Progress Notes (Deleted)
SATURATION QUALIFICATIONS: (This note is used to comply with regulatory documentation for home oxygen)  Patient Saturations on Room Air at Rest = 87%  Patient Saturations on Room Air while Ambulating = 80%  Patient Saturations on 4 Liters of oxygen while Ambulating = 93%  Please briefly explain why patient needs home oxygen: COPD, Lung cancer

## 2017-07-27 NOTE — Progress Notes (Signed)
Pt arrival from ED at 1900 on Bipap. Pt having increased WOB, wheezing throughout bases, very anxious, sitting up in bed. ELink called, one time order for Xanax given, pt became to rest more comfortably. Pt switched from Bipap to HFNC. Tolerating well. Will continue to monitor.

## 2017-07-27 NOTE — Progress Notes (Signed)
*  PRELIMINARY RESULTS* Echocardiogram 2D Echocardiogram has been performed. Definity Contrast used on this exam.  Chad Hess S Chad Hess 07/27/2017, 4:00 PM

## 2017-07-27 NOTE — Progress Notes (Signed)
Pt stable on 5L; Family at bedside.  Report called to 1A for transport to 115.  Family updated and case management referral made to renew oxygen therapy for home use prior to discharge. Bubba Camp, RN

## 2017-07-27 NOTE — Progress Notes (Signed)
[place] [facility] [place] NAME: Chad Hess    MR#:  [id number]  DATE OF BIRTH:  07/02/1951  SUBJECTIVE:  CHIEF COMPLAINT:   Chief Complaint  Patient presents with  . Shortness of Breath  . Chest Pain   Feels better with breathing Still has SOB and cough  REVIEW OF SYSTEMS:    Review of Systems  Constitutional: Positive for malaise/fatigue. Negative for chills and fever.  HENT: Negative for sore throat.   Eyes: Negative for blurred vision, double vision and pain.  Respiratory: Positive for cough and shortness of breath. Negative for hemoptysis and wheezing.   Cardiovascular: Negative for chest pain, palpitations, orthopnea and leg swelling.  Gastrointestinal: Negative for abdominal pain, constipation, diarrhea, heartburn, nausea and vomiting.  Genitourinary: Negative for dysuria and hematuria.  Musculoskeletal: Negative for back pain and joint pain.  Skin: Negative for rash.  Neurological: Positive for weakness. Negative for sensory change, speech change, focal weakness and headaches.  Endo/Heme/Allergies: Does not bruise/bleed easily.  Psychiatric/Behavioral: Negative for depression. The patient is not nervous/anxious.    DRUG ALLERGIES:   Allergies  Allergen Reactions  . Chantix [Varenicline]    VITALS:  Blood pressure 129/75, pulse 95, temperature 98.1 F (36.7 C), temperature source Oral, resp. rate (!) 22, height 5' 7.5" (1.715 m), weight 79.4 kg (175 lb 1.6 oz), SpO2 98 %.  PHYSICAL EXAMINATION:   Physical Exam  GENERAL:  66 y.o.-year-old patient lying in the bed with no acute distress.  EYES: Pupils equal, round, reactive to light and accommodation. No scleral icterus. Extraocular muscles intact.  HEENT: Head atraumatic, normocephalic. Oropharynx and nasopharynx clear.  NECK:  Supple, no jugular venous distention. No thyroid enlargement, no tenderness.  LUNGS: Normal breath sounds bilaterally, no wheezing, rales,  rhonchi. No use of accessory muscles of respiration.  CARDIOVASCULAR: S1, S2 normal. No murmurs, rubs, or gallops.  ABDOMEN: Soft, nontender, nondistended. Bowel sounds present. No organomegaly or mass.  EXTREMITIES: No cyanosis, clubbing or edema b/l.    NEUROLOGIC: Cranial nerves II through XII are intact. No focal Motor or sensory deficits b/l.   PSYCHIATRIC: The patient is alert and oriented x 3.  SKIN: No obvious rash, lesion, or ulcer.   LABORATORY PANEL:   CBC  Recent Labs Lab 07/27/17 0311  WBC 10.0  HGB 15.2  HCT 43.1  PLT 198   ------------------------------------------------------------------------------------------------------------------ Chemistries   Recent Labs Lab 07/26/17 1933 07/27/17 0311  NA  --  135  K  --  4.2  CL  --  101  CO2  --  23  GLUCOSE  --  168*  BUN  --  21*  CREATININE  --  1.26*  CALCIUM  --  8.7*  MG 1.8  --    ------------------------------------------------------------------------------------------------------------------  Cardiac Enzymes  Recent Labs Lab 07/27/17 0311  TROPONINI 0.27*   ------------------------------------------------------------------------------------------------------------------  RADIOLOGY:  Dg Chest Portable 1 View  Result Date: 07/26/2017 CLINICAL DATA:  Shortness of breath EXAM: PORTABLE CHEST 1 VIEW COMPARISON:  None. FINDINGS: Mild cardiomegaly. Mediastinal shadows are normal. Suspicion of early interstitial edema. No infiltrate, collapse or effusion. No significant bone finding. IMPRESSION: Cardiomegaly.  Suspicion of early interstitial edema. Electronically Signed   By: [name] M.D.   On: 07/26/2017 15:32     ASSESSMENT AND PLAN:   * Acute on chronic hypoxic respiratory failure due to COPD exacerbation and CHF exacerbation Wean O2 as tolerated. Bipap QHS  * Acute COPD exacerbation -IV steroids, Antibiotics - Scheduled  Nebulizers - Inhalers -Wean O2 as tolerated - Consult pulmonary  if no improvement  * Acute on chronic systolic chf - IV Lasix, Beta blockers - Input and Output - Counseled to limit fluids and Salt - Monitor Bun/Cr and Potassium - Echo - pending -Cardiology follow up after discharge  * Mild elevation in troponin of 0.06 likely demand ischemia from his respiratory issue and CHF. No EKG changes.  Check Echocardiogram  * Paroxysmal atrial fibrillation. Continue rate control medications and aspirin.  * DVT prophylaxis with Lovenox  All the records are reviewed and case discussed with Care Management/Social Worker Management plans discussed with the patient, family and they are in agreement.  CODE STATUS: FULL CODE  DVT Prophylaxis: SCDs  TOTAL TIME TAKING CARE OF THIS PATIENT: 35 minutes.   POSSIBLE D/C IN 1-2 DAYS, DEPENDING ON CLINICAL CONDITION.  Hillary Bow R M.D on 07/27/2017 at 11:51 AM  Between 7am to 6pm - Pager - [phone]  After 6pm go to [email or web address] - password [email or web address] [facility]  Office  [phone]  CC: Primary care physician; Sherrin Daisy, MD  Note: This dictation was prepared with Dragon dictation along with smaller phrase technology. Any transcriptional errors that result from this process are unintentional.

## 2017-07-27 NOTE — Progress Notes (Signed)
[name] received an Order Requisition that patient needed prayer. Chaplain went and prayed with the patient. Patient expressed he wished he could stop smoking so he could be around a little longer for his grandchildren. So we prayed that God do what was in his will so Chad Hess could get out and back home with his grandchildren. That was the prayer that he wanted me to pray and that is what I prayed.

## 2017-07-27 NOTE — Progress Notes (Signed)
PULMONARY / CRITICAL CARE MEDICINE   Name: RAYHAN GROLEAU MRN: [id number] DOB: 12/17/1950    ADMISSION DATE:  07/26/2017   CONSULTATION DATE:  07/26/2017  REFERRING MD:  Dr Vianne Bulls  REASON: Acute hypoxemic respiratory failure  CHIEF COMPLAINT:  Acute dyspnea  HISTORY OF PRESENT ILLNESS:  This is a 66 y/o WM with a PMH as indicated below, on home CPAP but non-complaint with use who presented to the ED via EMS with worsening dyspnea. Patient states that he started feeling short of breath  This morning and symptoms got worse this afternoon hence he decided to call EMS. Upon EMS' arrival, patient was hypoxic on RA with an SPO2 of 70%. He was placed on CPAP and transferred to the ED. At the ED, he was switched to BiPAP. Patient received IV and inhaled steroids. He is now off BiPAP in High flow [place]. He reports significant improvement in dyspnea. Offers no complaints His cardiologist is Dr. Ubaldo Glassing and his pulmonologist is Dr. Raul Del. His last echo was on 02/28/2016.   SUBJECTIVE: No acute issues overnight. Transitioned to Hi-Flow [place]. Reports improved dyspnea and wheezing.  VITAL SIGNS: BP 113/78   Pulse 90   Temp 98.1 F (36.7 C) (Oral)   Resp 18   Ht 5\' 7"  (1.702 m)   Wt 174 lb 6.1 oz (79.1 kg)   SpO2 95%   BMI 27.31 kg/m   HEMODYNAMICS:    VENTILATOR SETTINGS: FiO2 (%):  [5 %-35 %] 35 %  INTAKE / OUTPUT: I/O last 3 completed shifts: In: 300 [IV Piggyback:300] Out: 200 [Urine:200]  PHYSICAL EXAMINATION: General: lying in bed, NAD Neuro:  AAO X4, no focal deficits HEENT: PERRLA, oral mucosa pink, trachea midline Cardiovascular: AP irregular, S1/S2, +2 pulses, +2 edema. Lungs:  Normal wob, improved breath sounds in all lung fields, no  wheezes, no rhonchi.  Abdomen:  Obese, normal bowel sounds, no organomegaly Musculoskeletal:  +rom, No deformities Skin:  Warm and dry  LABS:  BMET  Recent Labs Lab 07/26/17 1548 07/27/17 0311  NA 137 135  K 4.7 4.2  CL 104 101  CO2  24 23  BUN 17 21*  CREATININE 1.19 1.26*  GLUCOSE 152* 168*    Electrolytes  Recent Labs Lab 07/26/17 1548 07/26/17 1933 07/27/17 0311  CALCIUM 9.0  --  8.7*  MG  --  1.8  --   PHOS  --  6.4*  --     CBC  Recent Labs Lab 07/26/17 1513 07/27/17 0311  WBC 13.0* 10.0  HGB 15.8 15.2  HCT 46.5 43.1  PLT 235 198    Coag's No results for input(s): APTT, INR in the last 168 hours.  Sepsis Markers  Recent Labs Lab 07/26/17 1513 07/26/17 1621 07/26/17 1933 07/27/17 0311  LATICACIDVEN 2.3*  --  2.0*  --   PROCALCITON  --  <0.10  --  0.29    ABG No results for input(s): PHART, PCO2ART, PO2ART in the last 168 hours.  Liver Enzymes No results for input(s): AST, ALT, ALKPHOS, BILITOT, ALBUMIN in the last 168 hours.  Cardiac Enzymes  Recent Labs Lab 07/26/17 1548 07/26/17 1933 07/27/17 0311  TROPONINI 0.06* 0.20* 0.27*    Glucose No results for input(s): GLUCAP in the last 168 hours.  Imaging Dg Chest Portable 1 View  Result Date: 07/26/2017 CLINICAL DATA:  Shortness of breath EXAM: PORTABLE CHEST 1 VIEW COMPARISON:  None. FINDINGS: Mild cardiomegaly. Mediastinal shadows are normal. Suspicion of early interstitial edema. No infiltrate, collapse or  effusion. No significant bone finding. IMPRESSION: Cardiomegaly.  Suspicion of early interstitial edema. Electronically Signed   By: [name] M.D.   On: 07/26/2017 15:32    STUDIES:  2 D echo pending  CULTURES: None  ANTIBIOTICS: None  SIGNIFICANT EVENTS: 07/26/17>Admitted  LINES/TUBES: PIVs  DISCUSSION: This is a 66 y/o male presenting with acute hypoxemic respiratory failure, pulmonary edema and acute CHF exacerbation  ASSESSMENT  Acute hypoxic respiratory failure Acute CHF exacerbation Acute on chronic COPD exacerbation Chest pain History of: Atrial fibrillation, Pulmonary hypertension, hypertension, CAD and OSA PLAN IV diuresis IV steroids Nebulized bronchodilator and  steriods Hemodynamics per ICU PROTOCOL Urine tox Monitor and replace electrolytes Supplemental O2 via High flow [place] and titrate off as tolerated 2- D echo Cycle cardiac enzymes Chest X-ray prn Resume home BP medications GI/DVT prophylaxis  Further changes in treatment plan pending clinical course and diagnostics  FAMILY  - Updates: No family at bedside. Patient updated on current treatment plan and expresses understanding    - Inter-disciplinary family meet or Palliative Care meeting due by:  day [place]. [facility] Pulmonary and [place] Pager [phone] or [phone]  07/27/2017, 7:00 AM

## 2017-07-28 ENCOUNTER — Inpatient Hospital Stay: Payer: Medicare HMO

## 2017-07-28 LAB — BASIC METABOLIC PANEL
Anion gap: 8 (ref 5–15)
BUN: 32 mg/dL — AB (ref 6–20)
CALCIUM: 8.6 mg/dL — AB (ref 8.9–10.3)
CHLORIDE: 97 mmol/L — AB (ref 101–111)
CO2: 27 mmol/L (ref 22–32)
CREATININE: 1.31 mg/dL — AB (ref 0.61–1.24)
GFR calc non Af Amer: 55 mL/min — ABNORMAL LOW (ref >60)
Glucose, Bld: 166 mg/dL — ABNORMAL HIGH (ref 65–99)
Potassium: 4.1 mmol/L (ref 3.5–5.1)
SODIUM: 132 mmol/L — AB (ref 135–145)

## 2017-07-28 LAB — ECHOCARDIOGRAM COMPLETE
HEIGHTINCHES: 67.5 in
WEIGHTICAEL: 2801.6 [oz_av]

## 2017-07-28 LAB — MAGNESIUM: MAGNESIUM: 2.1 mg/dL (ref 1.7–2.4)

## 2017-07-28 NOTE — Care Management Note (Signed)
Case Management Note  Patient Details  Name: IBRAHAM LEVI MRN: [id number] Date of Birth: 07-Jul-1951  Subjective/Objective:      Mr Lembo reports that his current PCP is actually a NP at [facility] in [place], Leta Jungling (spelling ?)NP. Per Mr Vandam, Dr Kandice Robinsons is retired. Mr Poplaski resides at home with his wife, and has a daughter that can assist with transportation needs. He denies having any home assistive equipment. He states that he is on 4-5L N/C oxygen at home that is provided by [facility]. He would like to have a portable oxygen concentrator but [facility] is unable to provide that for him. Mr Aguiniga reports that his current prescription for oxygen is expiring and that [facility] is coming to his home on 08/02/17 to evaluate his oxygen needs. He reports that he needs an over-night 02 Sat measurement. He would like to have all this done during this current hospitalization and he is agreeable to having another home oxygen provider. Case management will follow for discharge planning.               Action/Plan:   Expected Discharge Date:                  Expected Discharge Plan:  [place]  In-House Referral:     Discharge planning Services  CM Consult  Post Acute Care Choice:    Choice offered to:     DME Arranged:    DME Agency:     HH Arranged:    [place] Agency:     Status of Service:  In process, will continue to follow  If discussed at Long Length of Stay Meetings, dates discussed:    Additional Comments:  Danamarie Minami A, RN 07/28/2017, 4:26 PM

## 2017-07-28 NOTE — Progress Notes (Signed)
[place] at [place] NAME: Chad Hess    MR#:  [id number]  DATE OF BIRTH:  December 20, 1950  SUBJECTIVE:  CHIEF COMPLAINT:   Chief Complaint  Patient presents with  . Shortness of Breath  . Chest Pain   Still on 4 L oxygen today. Shortness of breath is improving. Dry cough. Chronic chest pain is unchanged. Family at bedside. Afebrile.  REVIEW OF SYSTEMS:    Review of Systems  Constitutional: Positive for malaise/fatigue. Negative for chills and fever.  HENT: Negative for sore throat.   Eyes: Negative for blurred vision, double vision and pain.  Respiratory: Positive for cough and shortness of breath. Negative for hemoptysis and wheezing.   Cardiovascular: Negative for chest pain, palpitations, orthopnea and leg swelling.  Gastrointestinal: Negative for abdominal pain, constipation, diarrhea, heartburn, nausea and vomiting.  Genitourinary: Negative for dysuria and hematuria.  Musculoskeletal: Negative for back pain and joint pain.  Skin: Negative for rash.  Neurological: Positive for weakness. Negative for sensory change, speech change, focal weakness and headaches.  Endo/Heme/Allergies: Does not bruise/bleed easily.  Psychiatric/Behavioral: Negative for depression. The patient is not nervous/anxious.    DRUG ALLERGIES:   Allergies  Allergen Reactions  . Chantix [Varenicline]    VITALS:  Blood pressure 114/66, pulse (!) 102, temperature 97.9 F (36.6 C), resp. rate (!) 22, height 5' 7.5" (1.715 m), weight 78.9 kg (174 lb), SpO2 95 %.  PHYSICAL EXAMINATION:   Physical Exam  GENERAL:  66 y.o.-year-old patient lying in the bed with no acute distress.  EYES: Pupils equal, round, reactive to light and accommodation. No scleral icterus. Extraocular muscles intact.  HEENT: Head atraumatic, normocephalic. Oropharynx and nasopharynx clear.  NECK:  Supple, no jugular venous distention. No thyroid enlargement, no tenderness.  LUNGS: Normal  breath sounds bilaterally, no wheezing, rales, rhonchi. No use of accessory muscles of respiration.  CARDIOVASCULAR: S1, S2 normal. No murmurs, rubs, or gallops.  ABDOMEN: Soft, nontender, nondistended. Bowel sounds present. No organomegaly or mass.  EXTREMITIES: No cyanosis, clubbing or edema b/l.    NEUROLOGIC: Cranial nerves II through XII are intact. No focal Motor or sensory deficits b/l.   PSYCHIATRIC: The patient is alert and oriented x 3.  SKIN: No obvious rash, lesion, or ulcer.   LABORATORY PANEL:   CBC  Recent Labs Lab 07/27/17 0311  WBC 10.0  HGB 15.2  HCT 43.1  PLT 198   ------------------------------------------------------------------------------------------------------------------ Chemistries   Recent Labs Lab 07/28/17 0459  NA 132*  K 4.1  CL 97*  CO2 27  GLUCOSE 166*  BUN 32*  CREATININE 1.31*  CALCIUM 8.6*  MG 2.1   ------------------------------------------------------------------------------------------------------------------  Cardiac Enzymes  Recent Labs Lab 07/27/17 0311  TROPONINI 0.27*   ------------------------------------------------------------------------------------------------------------------  RADIOLOGY:  Dg Chest Portable 1 View  Result Date: 07/26/2017 CLINICAL DATA:  Shortness of breath EXAM: PORTABLE CHEST 1 VIEW COMPARISON:  None. FINDINGS: Mild cardiomegaly. Mediastinal shadows are normal. Suspicion of early interstitial edema. No infiltrate, collapse or effusion. No significant bone finding. IMPRESSION: Cardiomegaly.  Suspicion of early interstitial edema. Electronically Signed   By: [name] M.D.   On: 07/26/2017 15:32     ASSESSMENT AND PLAN:   * Acute on chronic hypoxic respiratory failure due to COPD exacerbation and CHF exacerbation Wean O2 as tolerated. Bipap QHS Slowly improving.  Repeat chest x-ray ordered for today.  * Acute COPD exacerbation -IV steroids, Antibiotics - Scheduled Nebulizers -  Inhalers -Wean O2 as tolerated  * Acute  on chronic systolic chf - IV Lasix, Beta blockers - Input and Output - Counseled to limit fluids and Salt - Monitor Bun/Cr and Potassium. Ordered labs. - Echo - ejection fraction 60%. LVH. No wall motion abnormalities -Cardiology follow up after discharge  * Mild elevation in troponin of 0.06 likely demand ischemia from his respiratory issue and CHF. No EKG changes.  Echocardiogram showed no wall motion abnormalities  * Paroxysmal atrial fibrillation. Continue rate control medications and aspirin.  * DVT prophylaxis with Lovenox  All the records are reviewed and case discussed with Care Management/Social Worker Management plans discussed with the patient, family and they are in agreement.  CODE STATUS: FULL CODE  DVT Prophylaxis: SCDs  TOTAL TIME TAKING CARE OF THIS PATIENT: 35 minutes.   POSSIBLE D/C IN 1-2 DAYS, DEPENDING ON CLINICAL CONDITION.  Hillary Bow R M.D on 07/28/2017 at 12:12 PM  Between 7am to 6pm - Pager - [phone]  After 6pm go to [email or web address] - password [email or web address] [facility]  Office  [phone]  CC: Primary care physician; Sherrin Daisy, MD  Note: This dictation was prepared with Dragon dictation along with smaller phrase technology. Any transcriptional errors that result from this process are unintentional.

## 2017-07-29 LAB — GLUCOSE, CAPILLARY: Glucose-Capillary: 124 mg/dL — ABNORMAL HIGH (ref 65–99)

## 2017-07-29 MED ORDER — LEVOFLOXACIN 500 MG PO TABS: 500.0000 mg | ORAL_TABLET | Freq: Every day | ORAL | 0 refills | Status: AC

## 2017-07-29 MED ORDER — PREDNISONE 10 MG (21) PO TBPK: ORAL_TABLET | ORAL | 0 refills | Status: DC

## 2017-07-29 MED ORDER — FUROSEMIDE 20 MG PO TABS: 20.0000 mg | ORAL_TABLET | Freq: Every day | ORAL | 0 refills | Status: DC

## 2017-07-29 MED ORDER — IPRATROPIUM-ALBUTEROL 0.5-2.5 (3) MG/3ML IN SOLN
3.0000 mL | Freq: Four times a day (QID) | RESPIRATORY_TRACT | Status: DC
  Administered 2017-07-29: 13:00:00 3 mL via RESPIRATORY_TRACT
  Filled 2017-07-29: qty 30

## 2017-07-29 NOTE — Care Management Important Message (Signed)
Important Message  Patient Details  Name: Chad Hess MRN: [id number] Date of Birth: 08/19/1951   Medicare Important Message Given:  N/A - LOS <3 / Initial given by admissions    Jolly Mango, RN 07/29/2017, 12:36 PM

## 2017-07-29 NOTE — Care Management (Signed)
[facility] unable to accept patient at this time. RNCM working on home health agency that can accept patient.

## 2017-07-29 NOTE — Progress Notes (Signed)
Pt is being discharged today, discharge instructions were reviewed with the patient and he verified understanding. Chad Hess 0 prescriptions were given to him. IV removed, all belongings were packed and returned to the patient. He will be rolled out in a wheelchair by staff.

## 2017-07-29 NOTE — Care Management Note (Signed)
Case Management Note  Patient Details  Name: HARRIE CAZAREZ MRN: [id number] Date of Birth: [birth year]  Subjective/Objective: Spoke with patient and son regarding home 02 needs. Patient is wanting a portable tank he can carry on his shoulder.  Ref[facility] to [facility] for further follow up hospital for discharge and use in the home. Marland Kitchen Requested Corene Cornea with [facility] call patient and assist him further. He has portable tanks he can bring tp th                  Action/Plan:   Expected Discharge Date:  07/29/17               Expected Discharge Plan:  Home/Self Care  In-House Referral:     Discharge planning Services  CM Consult  Post Acute Care Choice:    Choice offered to:     DME Arranged:    DME Agency:     HH Arranged:    HH Agency:     Status of Service:  Completed, signed off  If discussed at [employer] of Stay Meetings, dates discussed:    Additional Comments:  Jolly Mango, RN 07/29/2017, 12:05 PM

## 2017-07-29 NOTE — Care Management Note (Signed)
Case Management Note  Patient Details  Name: Chad Hess MRN: [id number] Date of Birth: 1951/08/08  Subjective/Objective:  [facility] to take patient for home health nursing and PT. Spoke with son and he is agreeable.                   Action/Plan:   Expected Discharge Date:  07/29/17               Expected Discharge Plan:  [place]  In-House Referral:     Discharge planning Services  CM Consult  Post Acute Care Choice:    Choice offered to:  Adult Children  DME Arranged:    DME Agency:     HH Arranged:  RN, PT HH Agency:  [facility]  Status of Service:  Completed, signed off  If discussed at [place] of Stay Meetings, dates discussed:    Additional Comments:  Jolly Mango, RN 07/29/2017, 2:44 PM

## 2017-07-29 NOTE — Progress Notes (Signed)
SATURATION QUALIFICATIONS: (This note is used to comply with regulatory documentation for home oxygen)  Patient Saturations on Room Air at Rest = 88%  Patient Saturations on Room Air while Ambulating = 86%  Patient Saturations on 2 Liters of oxygen while Ambulating = 95%  Please briefly explain why patient needs home oxygen: copd

## 2017-07-29 NOTE — Care Management Note (Signed)
Case Management Note  Patient Details  Name: Chad Hess MRN: [id number] Date of Birth: [birth year]  Subjective/Objective:  Spoke with son regarding home health nursing and PT. He is agreeable and has no agency preference when offered choice. Referral to [facility].                    Action/Plan: Discharging to home with  [facility] and RN and PT.   Expected Discharge Date:  07/29/17               Expected Discharge Plan:  [place]  In-House Referral:     Discharge planning Services  CM Consult  Post Acute Care Choice:    Choice offered to:  Adult Children  DME Arranged:    DME Agency:     HH Arranged:  RN, PT HH Agency:     Status of Service:  Completed, signed off  If discussed at [place] of Stay Meetings, dates discussed:    Additional Comments:  Jolly Mango, RN 07/29/2017, 12:34 PM

## 2017-07-30 NOTE — Discharge Summary (Signed)
[place] at [place] NAME: Chad Hess    MR#:  [id number]  DATE OF BIRTH:  17-Aug-1951  DATE OF ADMISSION:  07/26/2017 ADMITTING PHYSICIAN: Hillary Bow, MD  DATE OF DISCHARGE: 07/29/2017  2:22 PM  PRIMARY CARE PHYSICIAN: Sherrin Daisy, MD   ADMISSION DIAGNOSIS:  COPD exacerbation ([place]) [J44.1] Acute respiratory failure with hypoxia ([place]) [J96.01] Sepsis, due to unspecified organism ([place]) [A41.9] Acute on chronic congestive heart failure, unspecified heart failure type ([place]) [I50.9]  DISCHARGE DIAGNOSIS:  Active Problems:   COPD exacerbation ([place])   SECONDARY DIAGNOSIS:   Past Medical History:  Diagnosis Date  . Atrial fibrillation ([place])   . CHF (congestive heart failure) ([place])   . Colon polyp   . COPD (chronic obstructive pulmonary disease) ([place])   . Coronary artery disease   . Diverticulitis   . ED (erectile dysfunction)   . GERD (gastroesophageal reflux disease)   . Hyperlipidemia   . Hypertension   . PUD (peptic ulcer disease)   . Pulmonary hypertension ([place])   . Sleep apnea    noncompliant with CPAP     ADMITTING HISTORY  HISTORY OF PRESENT ILLNESS:  Chad Hess  is a 66 y.o. male with a known history of COPD, systolic CHF, home oxygen, atrial fibrillation, hypertension, tobacco abuse presents to the emergency room complaining of shortness of breath of one day. Patient has had wheezing. When EMS arrived he was saturating in the 12s. Was placed on CPAP. Addition to BiPAP in the emergency room. Continues to have tachypnea. Feels some improvement. Chest x-ray shows mild edema. He had moderate chest pain at home which she has chronically on and off. Troponin is 0.06.  HOSPITAL COURSE:   * Acute on chronic hypoxic respiratory failure due to COPD exacerbation and CHF exacerbation. Wean O2 as tolerated. Bipap QHS. Slowly improving.  Repeat chest x-ray showed atelectasis  * Acute COPD exacerbation -IV steroids,  Antibiotics change to PO at discharge - Nebulizers - Inhalers - On O2 at discharge  * Acute on chronic systolic chf - IV Lasix with good diuresis. Continue PO lasix at discharge - Beta blockers - Input and Output - Counseled to limit fluids and Salt. - Monitor Bun/Cr and Potassium. Ordered labs. - Echo - ejection fraction 60%. LVH. No wall motion abnormalities. -Cardiology follow up after discharge.  * Mild elevation in troponin of 0.06 likely demand ischemia from his respiratory issue and CHF. No EKG changes.  Echocardiogram showed no wall motion abnormalities  * Paroxysmal atrial fibrillation. Continue rate control medications and aspirin.  Stable for discharge home  CONSULTS OBTAINED:    DRUG ALLERGIES:   Allergies  Allergen Reactions  . Chantix [Varenicline]     DISCHARGE MEDICATIONS:   Discharge Medication List as of 07/29/2017  1:24 PM    START taking these medications   Details  furosemide (LASIX) 20 MG tablet Take 1 tablet (20 mg total) by mouth daily., Starting Mon 07/29/2017, Until Tue 07/29/2018, Normal    levofloxacin (LEVAQUIN) 500 MG tablet Take 1 tablet (500 mg total) by mouth daily., Starting Mon 07/29/2017, Until Thu 08/08/2017, Normal    predniSONE (STERAPRED UNI-PAK 21 TAB) 10 MG (21) TBPK tablet 60 mg day 1 and taper by 10 mg daily, Normal      CONTINUE these medications which have NOT CHANGED   Details  aspirin 81 MG tablet Take 81 mg by mouth daily., Historical Med    atorvastatin (LIPITOR) 40 MG tablet Take 40  mg by mouth daily., Historical Med    budesonide-formoterol (SYMBICORT) 160-4.5 MCG/ACT inhaler Inhale 2 puffs into the lungs 2 (two) times daily., Historical Med    metoprolol succinate (TOPROL-XL) 100 MG 24 hr tablet Take 200 mg by mouth daily. Take with or immediately following a meal. , Historical Med    Multiple Vitamin (MULTIVITAMIN) tablet Take 1 tablet by mouth daily., Historical Med    albuterol (PROVENTIL HFA;VENTOLIN HFA)  108 (90 Base) MCG/ACT inhaler Inhale 2 puffs into the lungs every 4 (four) hours as needed for wheezing or shortness of breath. , Historical Med    albuterol (PROVENTIL) (2.5 MG/3ML) 0.083% nebulizer solution Take 2.5 mg by nebulization daily. , Historical Med    nitroGLYCERIN (NITROSTAT) 0.4 MG SL tablet Place 0.4 mg under the tongue every 5 (five) minutes as needed for chest pain., Historical Med      STOP taking these medications     tiotropium (SPIRIVA) 18 MCG inhalation capsule         Today   VITAL SIGNS:  Blood pressure 128/78, pulse 100, temperature 97.6 F (36.4 C), temperature source Oral, resp. rate 17, height 5' 7.5" (1.715 m), weight 80.6 kg (177 lb 11.2 oz), SpO2 98 %.  I/O:  No intake or output data in the 24 hours ending 07/30/17 1649  PHYSICAL EXAMINATION:  Physical Exam  GENERAL:  66 y.o.-year-old patient lying in the bed with no acute distress.  LUNGS: Normal breath sounds bilaterally, no wheezing, rales,rhonchi or crepitation. No use of accessory muscles of respiration.  CARDIOVASCULAR: S1, S2 normal. No murmurs, rubs, or gallops.  ABDOMEN: Soft, non-tender, non-distended. Bowel sounds present. No organomegaly or mass.  NEUROLOGIC: Moves all 4 extremities. PSYCHIATRIC: The patient is alert and oriented x 3.  SKIN: No obvious rash, lesion, or ulcer.   DATA REVIEW:   CBC  Recent Labs Lab 07/27/17 0311  WBC 10.0  HGB 15.2  HCT 43.1  PLT 198    Chemistries   Recent Labs Lab 07/28/17 0459  NA 132*  K 4.1  CL 97*  CO2 27  GLUCOSE 166*  BUN 32*  CREATININE 1.31*  CALCIUM 8.6*  MG 2.1    Cardiac Enzymes  Recent Labs Lab 07/27/17 0311  TROPONINI 0.27*    Microbiology Results  Results for orders placed or performed during the hospital encounter of 07/26/17  MRSA PCR Screening     Status: None   Collection Time: 07/26/17  7:38 PM  Result Value Ref Range Status   MRSA by PCR NEGATIVE NEGATIVE Final    Comment:        The GeneXpert  MRSA Assay (FDA approved for NASAL specimens only), is one component of a comprehensive MRSA colonization surveillance program. It is not intended to diagnose MRSA infection nor to guide or monitor treatment for MRSA infections.     RADIOLOGY:  No results found.  Follow up with PCP in 1 week.  Management plans discussed with the patient, family and they are in agreement.  CODE STATUS:  Code Status History    Date Active Date Inactive Code Status Order ID Comments User Context   07/26/2017  4:31 PM 07/29/2017  5:38 PM Full Code [id number]  Hillary Bow, MD ED      TOTAL TIME TAKING CARE OF THIS PATIENT ON DAY OF DISCHARGE: more than 30 minutes.   Hillary Bow R M.D on 07/30/2017 at 4:49 PM  Between 7am to 6pm - Pager - [phone]  After 6pm go to [email or web address] -  password [email or web address] [place] [facility]  Office  [phone]  CC: Primary care physician; Sherrin Daisy, MD  Note: This dictation was prepared with Dragon dictation along with smaller phrase technology. Any transcriptional errors that result from this process are unintentional.

## 2017-08-15 ENCOUNTER — Encounter: Payer: Self-pay | Admitting: Family

## 2017-08-15 ENCOUNTER — Ambulatory Visit: Payer: Medicare HMO | Attending: Family | Admitting: Family

## 2017-08-15 DIAGNOSIS — F17210 Nicotine dependence, cigarettes, uncomplicated: Secondary | ICD-10-CM | POA: Insufficient documentation

## 2017-08-15 DIAGNOSIS — J449 Chronic obstructive pulmonary disease, unspecified: Secondary | ICD-10-CM | POA: Insufficient documentation

## 2017-08-15 DIAGNOSIS — I272 Pulmonary hypertension, unspecified: Secondary | ICD-10-CM | POA: Insufficient documentation

## 2017-08-15 DIAGNOSIS — I5032 Chronic diastolic (congestive) heart failure: Secondary | ICD-10-CM | POA: Diagnosis present

## 2017-08-15 DIAGNOSIS — Z720 Tobacco use: Secondary | ICD-10-CM

## 2017-08-15 DIAGNOSIS — I110 Hypertensive heart disease with heart failure: Secondary | ICD-10-CM | POA: Diagnosis not present

## 2017-08-15 DIAGNOSIS — K219 Gastro-esophageal reflux disease without esophagitis: Secondary | ICD-10-CM | POA: Insufficient documentation

## 2017-08-15 DIAGNOSIS — I10 Essential (primary) hypertension: Secondary | ICD-10-CM | POA: Insufficient documentation

## 2017-08-15 DIAGNOSIS — K279 Peptic ulcer, site unspecified, unspecified as acute or chronic, without hemorrhage or perforation: Secondary | ICD-10-CM | POA: Diagnosis not present

## 2017-08-15 DIAGNOSIS — I2510 Atherosclerotic heart disease of native coronary artery without angina pectoris: Secondary | ICD-10-CM | POA: Insufficient documentation

## 2017-08-15 DIAGNOSIS — I4891 Unspecified atrial fibrillation: Secondary | ICD-10-CM | POA: Diagnosis not present

## 2017-08-15 DIAGNOSIS — E785 Hyperlipidemia, unspecified: Secondary | ICD-10-CM | POA: Insufficient documentation

## 2017-08-15 DIAGNOSIS — G4733 Obstructive sleep apnea (adult) (pediatric): Secondary | ICD-10-CM | POA: Insufficient documentation

## 2017-08-15 DIAGNOSIS — Z9119 Patient's noncompliance with other medical treatment and regimen: Secondary | ICD-10-CM | POA: Diagnosis not present

## 2017-08-15 DIAGNOSIS — J410 Simple chronic bronchitis: Secondary | ICD-10-CM

## 2017-08-15 DIAGNOSIS — I482 Chronic atrial fibrillation, unspecified: Secondary | ICD-10-CM

## 2017-08-15 DIAGNOSIS — Z79899 Other long term (current) drug therapy: Secondary | ICD-10-CM | POA: Diagnosis not present

## 2017-08-15 DIAGNOSIS — Z7982 Long term (current) use of aspirin: Secondary | ICD-10-CM | POA: Diagnosis not present

## 2017-08-15 MED ORDER — ALBUTEROL SULFATE HFA 108 (90 BASE) MCG/ACT IN AERS: 2.0000 | INHALATION_SPRAY | RESPIRATORY_TRACT | 5 refills | Status: DC | PRN

## 2017-08-15 MED ORDER — FUROSEMIDE 20 MG PO TABS: 20.0000 mg | ORAL_TABLET | Freq: Every day | ORAL | 5 refills | Status: AC

## 2017-08-15 NOTE — Progress Notes (Signed)
Agree with pharmacist note below.  Physical exam and ROS were done by myself  He is concerned that he only has 1 oxygen tank remaining and has called [place] from [facility] and neither company has a prescription. Upon reviewing his chart, there is mention of [facility] and patient says that he will call them. Also advised him to stop at Dr. Gust Brooms office while he's out and see if he can speak with a nurse about this. Says that he had an overnight oximetry recently done but doesn't have the results of that.   Tobacco cessation discussed for 3 minutes with him.  Return in 1 month or sooner for any questions/problems before then.  Darylene Price, FNP  HF Clinic at [facility]    Patient ID: Chad Hess, male    DOB: 1951-03-19, 66 y.o.   MRN: [id number]  HPI   Mr. Olmeda is a 66 year old male with a past medical history of COPD, home oxygen, atrial fibrillation, hypertension, CAD, GERD, hyperlipidemia, PUD, pulmonary HTN, obstructive sleep apnea, current tobacco abuse and chronic heart failure.   Most recent Echo (07/27/17) showed LV EF 55-60%.   Admitted 07/26/17 with chief complaint of SOB and chest pain. He was placed on BiPAP and had troponin of 0.06 that increased to 0.27 while in ED. Was discharged after 3 days with the addition of Lasix.   Patient presents to heart failure clinic for initial visit with chief complaint of shortness of breath and wheezing when carrying out every day ordinary activities. Has been smoking for 50+ years and says the patches he gets in the hospital work best. He has had multiple attempts at quitting but has not succeeded. Is ready to quit now. Denies any edema or recent chest pain and weighs himself daily.    Past Medical History:  Diagnosis Date  . Atrial fibrillation ([place])   . CHF (congestive heart failure) ([place])   . Colon polyp   . COPD (chronic obstructive pulmonary disease) ([place])   . Coronary artery disease   .  Diverticulitis   . ED (erectile dysfunction)   . GERD (gastroesophageal reflux disease)   . Hyperlipidemia   . Hypertension   . PUD (peptic ulcer disease)   . Pulmonary hypertension ([place])   . Sleep apnea    noncompliant with CPAP   Past Surgical History:  Procedure Laterality Date  . COLONOSCOPY  12/07/2004  . COLONOSCOPY N/A 04/03/2016   Procedure: COLONOSCOPY;  Surgeon: Lollie Sails, MD;  Location: [facility] ENDOSCOPY;  Service: Endoscopy;  Laterality: N/A;  . COLONOSCOPY WITH PROPOFOL N/A 02/20/2016   Procedure: COLONOSCOPY WITH PROPOFOL;  Surgeon: Lollie Sails, MD;  Location: [facility] ENDOSCOPY;  Service: Endoscopy;  Laterality: N/A;  . COLONOSCOPY WITH PROPOFOL N/A 04/02/2016   Procedure: COLONOSCOPY WITH PROPOFOL;  Surgeon: Lollie Sails, MD;  Location: [facility] ENDOSCOPY;  Service: Endoscopy;  Laterality: N/A;  . HERNIA REPAIR Right    Family History  Problem Relation Age of Onset  . Sudden death Mother    Social History  Substance Use Topics  . Smoking status: Current Every Day Smoker    Packs/day: 1.00    Years: 50.00  . Smokeless tobacco: Never Used  . Alcohol use Yes   Allergies  Allergen Reactions  . Chantix [Varenicline]    Prior to Admission medications   Medication Sig Start Date End Date Taking? Authorizing Provider  albuterol (PROVENTIL HFA;VENTOLIN HFA) 108 (90 Base) MCG/ACT inhaler Inhale 2 puffs  into the lungs every 4 (four) hours as needed for wheezing or shortness of breath. 08/15/17  Yes Hackney, Otila Kluver A, FNP  albuterol (PROVENTIL) (2.5 MG/3ML) 0.083% nebulizer solution Take 2.5 mg by nebulization daily.    Yes [provider]  aspirin 81 MG tablet Take 81 mg by mouth daily.   Yes [provider]  atorvastatin (LIPITOR) 40 MG tablet Take 40 mg by mouth daily.   Yes [provider]  furosemide (LASIX) 20 MG tablet Take 1 tablet (20 mg total) by mouth daily. 08/15/17 08/15/18 Yes Darylene Price A, FNP  metoprolol succinate (TOPROL-XL)  100 MG 24 hr tablet Take 200 mg by mouth daily. Take with or immediately following a meal.    Yes [provider]  Multiple Vitamin (MULTIVITAMIN) tablet Take 1 tablet by mouth daily.   Yes [provider]  nitroGLYCERIN (NITROSTAT) 0.4 MG SL tablet Place 0.4 mg under the tongue every 5 (five) minutes as needed for chest pain.   Yes [provider]  budesonide-formoterol (SYMBICORT) 160-4.5 MCG/ACT inhaler Inhale 2 puffs into the lungs 2 (two) times daily.    [provider]   Review of Systems  Constitutional: Positive for fatigue. Negative for appetite change.  HENT: Negative for congestion, postnasal drip and sore throat.   Eyes: Negative.   Respiratory: Positive for cough, shortness of breath and wheezing. Negative for chest tightness.   Cardiovascular: Negative for chest pain, palpitations and leg swelling.  Gastrointestinal: Negative for abdominal distention and abdominal pain.  Endocrine: Negative.   Genitourinary: Negative.   Musculoskeletal: Negative for back pain and neck pain.  Skin: Negative.   Allergic/Immunologic: Negative.   Neurological: Negative for dizziness and light-headedness.  Hematological: Negative for adenopathy. Does not bruise/bleed easily.  Psychiatric/Behavioral: Negative for dysphoric mood and sleep disturbance. The patient is not nervous/anxious.    Vitals:   08/15/17 1156  BP: 124/80  Pulse: (!) 104  Resp: 20  SpO2: 98%  Weight: 179 lb 6 oz (81.4 kg)  Height: 5\' 7"  (1.702 m)   Wt Readings from Last 3 Encounters:  08/15/17 179 lb 6 oz (81.4 kg)  07/29/17 177 lb 11.2 oz (80.6 kg)  04/02/16 180 lb (81.6 kg)   Lab Results  Component Value Date   CREATININE 1.31 (H) 07/28/2017   CREATININE 1.26 (H) 07/27/2017   CREATININE 1.19 07/26/2017    Physical Exam  Constitutional: He is oriented to person, place, and time. He appears well-developed and well-nourished.  HENT:  Head: Normocephalic and atraumatic.  Neck:  Normal range of motion. Neck supple. No JVD present.  Cardiovascular: An irregular rhythm present. Tachycardia present.   Pulmonary/Chest: Effort normal. He has wheezes in the right lower field and the left lower field. He has no rales.  Abdominal: Soft. He exhibits no distension. There is no tenderness.  Musculoskeletal: He exhibits no edema or tenderness.  Neurological: He is alert and oriented to person, place, and time.  Skin: Skin is warm and dry.  Psychiatric: He has a normal mood and affect. His behavior is normal. Thought content normal.  Nursing note and vitals reviewed.   Assessment and Plan:   1. Chronic Heart Failure with preserved ejection fraction- - Echo (07/27/17) 55-60% - NYHA class II - euvolemic today - instructed to weigh daily; Instructed to call for overnight weight gain >2 pounds or weekly weight gain >5 pounds  - Reminded to keep sodium intake 2000mg /day; Will try Mrs. Dash to cut back on daily sodium intake  -  Was started on Lasix upon discharge; Says he does not have refills on his Lasix and has about 10 days left; Provided patient with new Rx for Lasix  - Follows with cardiologist Ubaldo Glassing) and last saw him 08/01/17  2. Atrial Fibrillation  - on metoprolol succinate 200mg  daily - Has previously deferred anticoagulation  - HR 104 in clinic today   3. Hypertension  - BP 124/80 today in clinic  - Continue current medications   4. COPD  - Says he has one more O2 tank left; will need to follow up with pulmonologist Raul Del)  - Patient did not bring in a current list of medications so unsure of what nebulizers he is using at home - Says the neublizers are causing worsening sores in mouth and is hard to eat; Instructed to rinse mouth out after each use of nebs  - Provided new Rx for Ventolin   5. Tobacco Use  - Is ready to quit; 50+ years smoking ~2 PPD  - Questioned about nicotine patches and nicotine inhaler - Talked about the nicotine patches and starting  with the 21mg  patch; also recommended using nicotine gum or lozenges prn while using patch to curb cravings; patients says he will try this   Verbally reviewed all medications. Reminded to bring in medication bottles to next visit.   Follow up in 1 month or sooner if any questions/problems come up.   Candelaria Stagers, PharmD Pharmacy Resident  08/15/17

## 2017-08-15 NOTE — Patient Instructions (Signed)
Begin weighing daily and call for an overnight weight gain of > 2 pounds or a weekly weight gain of >5 pounds. 

## 2017-09-09 ENCOUNTER — Ambulatory Visit: Payer: Medicare HMO | Attending: Family | Admitting: Family

## 2017-09-09 ENCOUNTER — Encounter: Payer: Self-pay | Admitting: Family

## 2017-09-09 VITALS — BP 144/86 | HR 89 | Resp 18 | Ht 67.0 in | Wt 174.2 lb

## 2017-09-09 DIAGNOSIS — Z9981 Dependence on supplemental oxygen: Secondary | ICD-10-CM | POA: Insufficient documentation

## 2017-09-09 DIAGNOSIS — I110 Hypertensive heart disease with heart failure: Secondary | ICD-10-CM | POA: Diagnosis not present

## 2017-09-09 DIAGNOSIS — Z888 Allergy status to other drugs, medicaments and biological substances status: Secondary | ICD-10-CM | POA: Insufficient documentation

## 2017-09-09 DIAGNOSIS — I4891 Unspecified atrial fibrillation: Secondary | ICD-10-CM | POA: Diagnosis not present

## 2017-09-09 DIAGNOSIS — I5032 Chronic diastolic (congestive) heart failure: Secondary | ICD-10-CM | POA: Diagnosis present

## 2017-09-09 DIAGNOSIS — J449 Chronic obstructive pulmonary disease, unspecified: Secondary | ICD-10-CM | POA: Insufficient documentation

## 2017-09-09 DIAGNOSIS — G4733 Obstructive sleep apnea (adult) (pediatric): Secondary | ICD-10-CM | POA: Diagnosis not present

## 2017-09-09 DIAGNOSIS — Z7982 Long term (current) use of aspirin: Secondary | ICD-10-CM | POA: Diagnosis not present

## 2017-09-09 DIAGNOSIS — F17210 Nicotine dependence, cigarettes, uncomplicated: Secondary | ICD-10-CM | POA: Diagnosis not present

## 2017-09-09 DIAGNOSIS — Z720 Tobacco use: Secondary | ICD-10-CM

## 2017-09-09 DIAGNOSIS — K219 Gastro-esophageal reflux disease without esophagitis: Secondary | ICD-10-CM | POA: Diagnosis not present

## 2017-09-09 DIAGNOSIS — Z8711 Personal history of peptic ulcer disease: Secondary | ICD-10-CM | POA: Diagnosis not present

## 2017-09-09 DIAGNOSIS — I272 Pulmonary hypertension, unspecified: Secondary | ICD-10-CM | POA: Diagnosis not present

## 2017-09-09 DIAGNOSIS — J410 Simple chronic bronchitis: Secondary | ICD-10-CM

## 2017-09-09 DIAGNOSIS — E785 Hyperlipidemia, unspecified: Secondary | ICD-10-CM | POA: Diagnosis not present

## 2017-09-09 DIAGNOSIS — Z79899 Other long term (current) drug therapy: Secondary | ICD-10-CM | POA: Insufficient documentation

## 2017-09-09 DIAGNOSIS — I482 Chronic atrial fibrillation, unspecified: Secondary | ICD-10-CM

## 2017-09-09 DIAGNOSIS — N529 Male erectile dysfunction, unspecified: Secondary | ICD-10-CM | POA: Insufficient documentation

## 2017-09-09 DIAGNOSIS — I2510 Atherosclerotic heart disease of native coronary artery without angina pectoris: Secondary | ICD-10-CM | POA: Diagnosis not present

## 2017-09-09 DIAGNOSIS — I10 Essential (primary) hypertension: Secondary | ICD-10-CM

## 2017-09-09 NOTE — Patient Instructions (Addendum)
Continue weighing daily and call for an overnight weight gain of > 2 pounds or a weekly weight gain of >5 pounds.    Smoking Cessation Quitting smoking is important to your health and has many advantages. However, it is not always easy to quit since nicotine is a very addictive drug. Oftentimes, people try 3 times or more before being able to quit. This document explains the best ways for you to prepare to quit smoking. Quitting takes hard work and a lot of effort, but you can do it. ADVANTAGES OF QUITTING SMOKING  You will live longer, feel better, and live better.  Your body will feel the impact of quitting smoking almost immediately.  Within 20 minutes, blood pressure decreases. Your pulse returns to its normal level.  After 8 hours, carbon monoxide levels in the blood return to normal. Your oxygen level increases.  After 24 hours, the chance of having a heart attack starts to decrease. Your breath, hair, and body stop smelling like smoke.  After 48 hours, damaged nerve endings begin to recover. Your sense of taste and smell improve.  After 72 hours, the body is virtually free of nicotine. Your bronchial tubes relax and breathing becomes easier.  After 2 to 12 weeks, lungs can hold more air. Exercise becomes easier and circulation improves.  The risk of having a heart attack, stroke, cancer, or lung disease is greatly reduced.  After 1 year, the risk of coronary heart disease is cut in half.  After 5 years, the risk of stroke falls to the same as a nonsmoker.  After 10 years, the risk of lung cancer is cut in half and the risk of other cancers decreases significantly.  After 15 years, the risk of coronary heart disease drops, usually to the level of a nonsmoker.  If you are pregnant, quitting smoking will improve your chances of having a healthy baby.  The people you live with, especially any children, will be healthier.  You will have extra money to spend on things other  than cigarettes. QUESTIONS TO THINK ABOUT BEFORE ATTEMPTING TO QUIT You may want to talk about your answers with your health care provider.  Why do you want to quit?  If you tried to quit in the past, what helped and what did not?  What will be the most difficult situations for you after you quit? How will you plan to handle them?  Who can help you through the tough times? Your family? Friends? A health care provider?  What pleasures do you get from smoking? What ways can you still get pleasure if you quit? Here are some questions to ask your health care provider:  How can you help me to be successful at quitting?  What medicine do you think would be best for me and how should I take it?  What should I do if I need more help?  What is smoking withdrawal like? How can I get information on withdrawal? GET READY  Set a quit date.  Change your environment by getting rid of all cigarettes, ashtrays, matches, and lighters in your home, car, or work. Do not let people smoke in your home.  Review your past attempts to quit. Think about what worked and what did not. GET SUPPORT AND ENCOURAGEMENT You have a better chance of being successful if you have help. You can get support in many ways.  Tell your family, friends, and coworkers that you are going to quit and need their support. Ask   them not to smoke around you.  Get individual, group, or telephone counseling and support. Programs are available at local hospitals and health centers. Call your local health department for information about programs in your area.  Spiritual beliefs and practices may help some smokers quit.  Download a "quit meter" on your computer to keep track of quit statistics, such as how long you have gone without smoking, cigarettes not smoked, and money saved.  Get a self-help book about quitting smoking and staying off tobacco. LEARN NEW SKILLS AND BEHAVIORS  Distract yourself from urges to smoke. Talk to  someone, go for a walk, or occupy your time with a task.  Change your normal routine. Take a different route to work. Drink tea instead of coffee. Eat breakfast in a different place.  Reduce your stress. Take a hot bath, exercise, or read a book.  Plan something enjoyable to do every day. Reward yourself for not smoking.  Explore interactive web-based programs that specialize in helping you quit. GET MEDICINE AND USE IT CORRECTLY Medicines can help you stop smoking and decrease the urge to smoke. Combining medicine with the above behavioral methods and support can greatly increase your chances of successfully quitting smoking.  Nicotine replacement therapy helps deliver nicotine to your body without the negative effects and risks of smoking. Nicotine replacement therapy includes nicotine gum, lozenges, inhalers, nasal sprays, and skin patches. Some may be available over-the-counter and others require a prescription.  Antidepressant medicine helps people abstain from smoking, but how this works is unknown. This medicine is available by prescription.  Nicotinic receptor partial agonist medicine simulates the effect of nicotine in your brain. This medicine is available by prescription. Ask your health care provider for advice about which medicines to use and how to use them based on your health history. Your health care provider will tell you what side effects to look out for if you choose to be on a medicine or therapy. Carefully read the information on the package. Do not use any other product containing nicotine while using a nicotine replacement product.  RELAPSE OR DIFFICULT SITUATIONS Most relapses occur within the first 3 months after quitting. Do not be discouraged if you start smoking again. Remember, most people try several times before finally quitting. You may have symptoms of withdrawal because your body is used to nicotine. You may crave cigarettes, be irritable, feel very hungry, cough  often, get headaches, or have difficulty concentrating. The withdrawal symptoms are only temporary. They are strongest when you first quit, but they will go away within 10-14 days. To reduce the chances of relapse, try to:  Avoid drinking alcohol. Drinking lowers your chances of successfully quitting.  Reduce the amount of caffeine you consume. Once you quit smoking, the amount of caffeine in your body increases and can give you symptoms, such as a rapid heartbeat, sweating, and anxiety.  Avoid smokers because they can make you want to smoke.  Do not let weight gain distract you. Many smokers will gain weight when they quit, usually less than 10 pounds. Eat a healthy diet and stay active. You can always lose the weight gained after you quit.  Find ways to improve your mood other than smoking. FOR MORE INFORMATION  www.smokefree.gov  Document Released: 10/30/2001 Document Revised: 03/22/2014 Document Reviewed: 02/14/2012 ExitCare Patient Information 2015 [employer]. This information is not intended to replace advice given to you by your health care provider. Make sure you discuss any questions you have with your   health care provider.  

## 2017-09-09 NOTE — Progress Notes (Signed)
Patient ID: Chad Hess, male    DOB: 09-09-1951, 66 y.o.   MRN: [id number]  HPI   Chad Hess is a 66 year old male with a past medical history of COPD, home oxygen, atrial fibrillation, hypertension, CAD, GERD, hyperlipidemia, PUD, pulmonary HTN, obstructive sleep apnea, current tobacco abuse and chronic heart failure.   Most recent Echo (07/27/17) showed LV EF 55-60%.   Admitted 07/26/17 with chief complaint of SOB and chest pain. He was placed on BiPAP and had troponin of 0.06 that increased to 0.27 while in ED. Was discharged after 3 days with the addition of Lasix.   Patient presents for a follow-up visit with a chief complaint of mild shortness of breath upon moderate exertion. He describes this as chronic in nature having been present for several years with varying levels of severity. He has associated fatigue, cough and difficulty sleeping. He denies any chest pain, edema, palpitations, dizziness or weight gain.   Past Medical History:  Diagnosis Date  . Atrial fibrillation ([place])   . CHF (congestive heart failure) ([place])   . Colon polyp   . COPD (chronic obstructive pulmonary disease) ([place])   . Coronary artery disease   . Diverticulitis   . ED (erectile dysfunction)   . GERD (gastroesophageal reflux disease)   . Hyperlipidemia   . Hypertension   . PUD (peptic ulcer disease)   . Pulmonary hypertension ([place])   . Sleep apnea    noncompliant with CPAP   Past Surgical History:  Procedure Laterality Date  . COLONOSCOPY  12/07/2004  . COLONOSCOPY N/A 04/03/2016   Procedure: COLONOSCOPY;  Surgeon: Lollie Sails, MD;  Location: [facility] ENDOSCOPY;  Service: Endoscopy;  Laterality: N/A;  . COLONOSCOPY WITH PROPOFOL N/A 02/20/2016   Procedure: COLONOSCOPY WITH PROPOFOL;  Surgeon: Lollie Sails, MD;  Location: [facility] ENDOSCOPY;  Service: Endoscopy;  Laterality: N/A;  . COLONOSCOPY WITH PROPOFOL N/A 04/02/2016   Procedure: COLONOSCOPY WITH PROPOFOL;  Surgeon: Lollie Sails, MD;   [facility] ENDOSCOPY;  Service: Endoscopy;  Laterality: N/A;  . HERNIA REPAIR Right    Family History  Problem Relation Age of Onset  . Sudden death Mother    Social History  Substance Use Topics  . Smoking status: Current Every Day Smoker    Packs/day: 1.00    Years: 50.00  . Smokeless tobacco: Never Used  . Alcohol use Yes   Allergies  Allergen Reactions  . Chantix [Varenicline]    Prior to Admission medications   Medication Sig Start Date End Date Taking? Authorizing Provider  albuterol (PROVENTIL HFA;VENTOLIN HFA) 108 (90 Base) MCG/ACT inhaler Inhale 2 puffs into the lungs every 4 (four) hours as needed for wheezing or shortness of breath. 08/15/17  Yes Hackney, Otila Kluver A, FNP  albuterol (PROVENTIL) (2.5 MG/3ML) 0.083% nebulizer solution Take 2.5 mg by nebulization daily.    Yes [provider]  aspirin 81 MG tablet Take 81 mg by mouth daily.   Yes [provider]  atorvastatin (LIPITOR) 40 MG tablet Take 40 mg by mouth daily.   Yes [provider]  furosemide (LASIX) 20 MG tablet Take 1 tablet (20 mg total) by mouth daily. 08/15/17 08/15/18 Yes Darylene Price A, FNP  metoprolol succinate (TOPROL-XL) 100 MG 24 hr tablet Take 200 mg by mouth daily. Take with or immediately following a meal.    Yes [provider]  Multiple Vitamin (MULTIVITAMIN) tablet Take 1 tablet by mouth daily.   Yes [provider]  nitroGLYCERIN (NITROSTAT)  0.4 MG SL tablet Place 0.4 mg under the tongue every 5 (five) minutes as needed for chest pain.   Yes [provider]  budesonide-formoterol (SYMBICORT) 160-4.5 MCG/ACT inhaler Inhale 2 puffs into the lungs 2 (two) times daily.    [provider]   Review of Systems  Constitutional: Positive for fatigue. Negative for appetite change.  HENT: Negative for congestion, postnasal drip and sore throat.   Eyes: Negative.   Respiratory: Positive for cough and shortness of breath. Negative for chest  tightness.   Cardiovascular: Negative for chest pain, palpitations and leg swelling.  Gastrointestinal: Negative for abdominal distention and abdominal pain.  Endocrine: Negative.   Genitourinary: Negative.   Musculoskeletal: Negative for back pain and neck pain.  Skin: Negative.   Allergic/Immunologic: Negative.   Neurological: Negative for dizziness and light-headedness.  Hematological: Negative for adenopathy. Does not bruise/bleed easily.  Psychiatric/Behavioral: Positive for sleep disturbance (napping during the day). Negative for dysphoric mood. The patient is not nervous/anxious.    Vitals:   09/09/17 1520  BP: (!) 144/86  Pulse: 89  Resp: 18  SpO2: 97%  Weight: 174 lb 4 oz (79 kg)  Height: 5\' 7"  (1.702 m)   Wt Readings from Last 3 Encounters:  09/09/17 174 lb 4 oz (79 kg)  08/15/17 179 lb 6 oz (81.4 kg)  07/29/17 177 lb 11.2 oz (80.6 kg)   Lab Results  Component Value Date   CREATININE 1.31 (H) 07/28/2017   CREATININE 1.26 (H) 07/27/2017   CREATININE 1.19 07/26/2017    Physical Exam  Constitutional: He is oriented to person, place, and time. He appears well-developed and well-nourished.  HENT:  Head: Normocephalic and atraumatic.  Neck: Normal range of motion. Neck supple. No JVD present.  Cardiovascular: Normal rate.  An irregular rhythm present.  Pulmonary/Chest: Effort normal. He has no wheezes. He has no rales.  Abdominal: Soft. He exhibits no distension. There is no tenderness.  Musculoskeletal: He exhibits no edema or tenderness.  Neurological: He is alert and oriented to person, place, and time.  Skin: Skin is warm and dry.  Psychiatric: He has a normal mood and affect. His behavior is normal. Thought content normal.  Nursing note and vitals reviewed.   Assessment and Plan:   1. Chronic Heart Failure with preserved ejection fraction- - Echo (07/27/17) 55-60% - NYHA class II - euvolemic today - weighing daily; reminded to call for an overnight weight  gain >2 pounds or weekly weight gain >5 pounds  - not adding salt to his food and trying to keep sodium intake to 2000mg  a day  - saw cardiologist Ubaldo Glassing) 08/01/17 - discussion had about possible cardiac catheterization in the future  2. Atrial Fibrillation  - on metoprolol succinate 200mg  daily - Has previously deferred anticoagulation  - HR 89 in clinic today   3. Hypertension  - BP looks good today - Continue current medications   4. COPD  - wearing oxygen at bedtime and as needed during the day - rarely having to use albuterol - saw pulmonologist Raul Del) on 08/27/17  5. Tobacco Use- - smoking ~ 1 ppd of cigarettes - complete cessation discussed for 3 minutes with him  Patient did not bring his medications nor a list. Each medication was verbally reviewed with the patient and he was encouraged to bring the bottles to every visit to confirm accuracy of list.  Follow up in 6 months or sooner for any questions/problems before then.

## 2017-10-23 ENCOUNTER — Inpatient Hospital Stay
Admission: EM | Admit: 2017-10-23 | Discharge: 2017-10-25 | DRG: 189 | Disposition: A | Discharge status: Home / Self Care | Payer: Medicare HMO | Attending: Internal Medicine | Admitting: Internal Medicine

## 2017-10-23 ENCOUNTER — Other Ambulatory Visit: Payer: Self-pay

## 2017-10-23 ENCOUNTER — Emergency Department: Payer: Medicare HMO

## 2017-10-23 DIAGNOSIS — J9601 Acute respiratory failure with hypoxia: Principal | ICD-10-CM | POA: Diagnosis present

## 2017-10-23 DIAGNOSIS — F17200 Nicotine dependence, unspecified, uncomplicated: Secondary | ICD-10-CM

## 2017-10-23 DIAGNOSIS — Z23 Encounter for immunization: Secondary | ICD-10-CM | POA: Diagnosis present

## 2017-10-23 DIAGNOSIS — J9621 Acute and chronic respiratory failure with hypoxia: Secondary | ICD-10-CM

## 2017-10-23 DIAGNOSIS — R739 Hyperglycemia, unspecified: Secondary | ICD-10-CM | POA: Diagnosis present

## 2017-10-23 DIAGNOSIS — Z7982 Long term (current) use of aspirin: Secondary | ICD-10-CM

## 2017-10-23 DIAGNOSIS — Z888 Allergy status to other drugs, medicaments and biological substances status: Secondary | ICD-10-CM | POA: Diagnosis not present

## 2017-10-23 DIAGNOSIS — Z86010 Personal history of colonic polyps: Secondary | ICD-10-CM

## 2017-10-23 DIAGNOSIS — F17210 Nicotine dependence, cigarettes, uncomplicated: Secondary | ICD-10-CM | POA: Diagnosis present

## 2017-10-23 DIAGNOSIS — R7301 Impaired fasting glucose: Secondary | ICD-10-CM | POA: Diagnosis present

## 2017-10-23 DIAGNOSIS — J441 Chronic obstructive pulmonary disease with (acute) exacerbation: Secondary | ICD-10-CM | POA: Diagnosis present

## 2017-10-23 DIAGNOSIS — K219 Gastro-esophageal reflux disease without esophagitis: Secondary | ICD-10-CM | POA: Diagnosis present

## 2017-10-23 DIAGNOSIS — Z820 Family history of epilepsy and other diseases of the nervous system: Secondary | ICD-10-CM | POA: Diagnosis not present

## 2017-10-23 DIAGNOSIS — I2510 Atherosclerotic heart disease of native coronary artery without angina pectoris: Secondary | ICD-10-CM | POA: Diagnosis present

## 2017-10-23 DIAGNOSIS — E785 Hyperlipidemia, unspecified: Secondary | ICD-10-CM | POA: Diagnosis present

## 2017-10-23 DIAGNOSIS — Z9119 Patient's noncompliance with other medical treatment and regimen: Secondary | ICD-10-CM | POA: Diagnosis not present

## 2017-10-23 DIAGNOSIS — I700 Atherosclerosis of aorta: Secondary | ICD-10-CM | POA: Diagnosis present

## 2017-10-23 DIAGNOSIS — I481 Persistent atrial fibrillation: Secondary | ICD-10-CM | POA: Diagnosis present

## 2017-10-23 DIAGNOSIS — I5032 Chronic diastolic (congestive) heart failure: Secondary | ICD-10-CM | POA: Diagnosis present

## 2017-10-23 DIAGNOSIS — J9602 Acute respiratory failure with hypercapnia: Secondary | ICD-10-CM | POA: Diagnosis present

## 2017-10-23 DIAGNOSIS — G4733 Obstructive sleep apnea (adult) (pediatric): Secondary | ICD-10-CM | POA: Diagnosis present

## 2017-10-23 DIAGNOSIS — Z8711 Personal history of peptic ulcer disease: Secondary | ICD-10-CM

## 2017-10-23 DIAGNOSIS — F1010 Alcohol abuse, uncomplicated: Secondary | ICD-10-CM | POA: Diagnosis present

## 2017-10-23 DIAGNOSIS — I110 Hypertensive heart disease with heart failure: Secondary | ICD-10-CM | POA: Diagnosis present

## 2017-10-23 DIAGNOSIS — I272 Pulmonary hypertension, unspecified: Secondary | ICD-10-CM | POA: Diagnosis present

## 2017-10-23 DIAGNOSIS — R0603 Acute respiratory distress: Secondary | ICD-10-CM | POA: Diagnosis present

## 2017-10-23 LAB — BLOOD GAS, VENOUS
Acid-base deficit: 4 mmol/L — ABNORMAL HIGH (ref 0.0–2.0)
BICARBONATE: 23.5 mmol/L (ref 20.0–28.0)
Delivery systems: POSITIVE
FIO2: 0.5
O2 SAT: 91.7 %
PATIENT TEMPERATURE: 37
PCO2 VEN: 50 mmHg (ref 44.0–60.0)
PO2 VEN: 71 mmHg — AB (ref 32.0–45.0)
pH, Ven: 7.28 (ref 7.250–7.430)

## 2017-10-23 LAB — BASIC METABOLIC PANEL
ANION GAP: 14 (ref 5–15)
BUN: 16 mg/dL (ref 6–20)
CALCIUM: 9.5 mg/dL (ref 8.9–10.3)
CO2: 22 mmol/L (ref 22–32)
Chloride: 99 mmol/L — ABNORMAL LOW (ref 101–111)
Creatinine, Ser: 1.2 mg/dL (ref 0.61–1.24)
GFR calc Af Amer: >60 mL/min (ref >60)
GLUCOSE: 159 mg/dL — AB (ref 65–99)
POTASSIUM: 4.2 mmol/L (ref 3.5–5.1)
Sodium: 135 mmol/L (ref 135–145)

## 2017-10-23 LAB — CBC
HEMATOCRIT: 50 % (ref 40.0–52.0)
HEMOGLOBIN: 16.8 g/dL (ref 13.0–18.0)
MCH: 33.7 pg (ref 26.0–34.0)
MCHC: 33.7 g/dL (ref 32.0–36.0)
MCV: 100.1 fL — ABNORMAL HIGH (ref 80.0–100.0)
Platelets: 232 10*3/uL (ref 150–440)
RBC: 4.99 MIL/uL (ref 4.40–5.90)
RDW: 14.6 % — AB (ref 11.5–14.5)
WBC: 9.9 10*3/uL (ref 3.8–10.6)

## 2017-10-23 LAB — MRSA PCR SCREENING: MRSA BY PCR: NEGATIVE

## 2017-10-23 LAB — PROCALCITONIN: Procalcitonin: <0.1 ng/mL

## 2017-10-23 LAB — TROPONIN I: Troponin I: <0.03 ng/mL (ref <0.03)

## 2017-10-23 LAB — GLUCOSE, CAPILLARY: GLUCOSE-CAPILLARY: 128 mg/dL — AB (ref 65–99)

## 2017-10-23 MED ORDER — IPRATROPIUM-ALBUTEROL 0.5-2.5 (3) MG/3ML IN SOLN
9.0000 mL | Freq: Once | RESPIRATORY_TRACT | Status: AC
  Administered 2017-10-23: 9 mL via RESPIRATORY_TRACT

## 2017-10-23 MED ORDER — FUROSEMIDE 20 MG PO TABS
20.0000 mg | ORAL_TABLET | Freq: Every day | ORAL | Status: DC
  Administered 2017-10-24 – 2017-10-25 (×2): 20 mg via ORAL
  Filled 2017-10-23 (×2): qty 1
  Filled 2017-10-23: qty 0.5

## 2017-10-23 MED ORDER — ONDANSETRON HCL 4 MG PO TABS: 4.0000 mg | ORAL_TABLET | Freq: Four times a day (QID) | ORAL | Status: DC | PRN

## 2017-10-23 MED ORDER — CEFTRIAXONE SODIUM IN DEXTROSE 20 MG/ML IV SOLN
1.0000 g | Freq: Once | INTRAVENOUS | Status: DC
  Filled 2017-10-23: qty 50

## 2017-10-23 MED ORDER — LORAZEPAM 2 MG/ML IJ SOLN: 2.0000 mg | INTRAMUSCULAR | Status: DC | PRN

## 2017-10-23 MED ORDER — ADULT MULTIVITAMIN W/MINERALS CH
1.0000 | ORAL_TABLET | Freq: Every day | ORAL | Status: DC
  Administered 2017-10-23: 1 via ORAL
  Filled 2017-10-23: qty 1

## 2017-10-23 MED ORDER — ENOXAPARIN SODIUM 40 MG/0.4ML ~~LOC~~ SOLN
40.0000 mg | SUBCUTANEOUS | Status: DC
  Administered 2017-10-23 – 2017-10-24 (×2): 40 mg via SUBCUTANEOUS
  Filled 2017-10-23 (×2): qty 0.4

## 2017-10-23 MED ORDER — NICOTINE 21 MG/24HR TD PT24
21.0000 mg | MEDICATED_PATCH | Freq: Every day | TRANSDERMAL | Status: DC
  Administered 2017-10-23 – 2017-10-25 (×3): 21 mg via TRANSDERMAL
  Filled 2017-10-23 (×3): qty 1

## 2017-10-23 MED ORDER — ACETAMINOPHEN 325 MG PO TABS: 650.0000 mg | ORAL_TABLET | Freq: Four times a day (QID) | ORAL | Status: DC | PRN

## 2017-10-23 MED ORDER — INSULIN ASPART 100 UNIT/ML ~~LOC~~ SOLN
0.0000 [IU] | Freq: Three times a day (TID) | SUBCUTANEOUS | Status: DC
  Administered 2017-10-23 – 2017-10-24 (×2): 1 [IU] via SUBCUTANEOUS
  Administered 2017-10-24: 3 [IU] via SUBCUTANEOUS
  Administered 2017-10-24: 1 [IU] via SUBCUTANEOUS
  Filled 2017-10-23 (×4): qty 1

## 2017-10-23 MED ORDER — LEVOFLOXACIN IN D5W 500 MG/100ML IV SOLN
500.0000 mg | INTRAVENOUS | Status: DC
  Administered 2017-10-23: 500 mg via INTRAVENOUS
  Filled 2017-10-23 (×2): qty 100

## 2017-10-23 MED ORDER — INSULIN ASPART 100 UNIT/ML ~~LOC~~ SOLN: 0.0000 [IU] | Freq: Every day | SUBCUTANEOUS | Status: DC

## 2017-10-23 MED ORDER — BUDESONIDE 0.5 MG/2ML IN SUSP
0.5000 mg | Freq: Two times a day (BID) | RESPIRATORY_TRACT | Status: DC
  Administered 2017-10-23 – 2017-10-25 (×4): 0.5 mg via RESPIRATORY_TRACT
  Filled 2017-10-23 (×4): qty 2

## 2017-10-23 MED ORDER — THIAMINE HCL 100 MG/ML IJ SOLN
100.0000 mg | Freq: Every day | INTRAMUSCULAR | Status: DC
  Administered 2017-10-23 – 2017-10-24 (×2): 100 mg via INTRAVENOUS
  Filled 2017-10-23 (×2): qty 2

## 2017-10-23 MED ORDER — ONDANSETRON HCL 4 MG/2ML IJ SOLN: 4.0000 mg | Freq: Four times a day (QID) | INTRAMUSCULAR | Status: DC | PRN

## 2017-10-23 MED ORDER — IPRATROPIUM-ALBUTEROL 0.5-2.5 (3) MG/3ML IN SOLN
3.0000 mL | Freq: Four times a day (QID) | RESPIRATORY_TRACT | Status: DC
  Administered 2017-10-23 – 2017-10-25 (×7): 3 mL via RESPIRATORY_TRACT
  Filled 2017-10-23 (×7): qty 3

## 2017-10-23 MED ORDER — METHYLPREDNISOLONE SODIUM SUCC 40 MG IJ SOLR
40.0000 mg | Freq: Two times a day (BID) | INTRAMUSCULAR | Status: DC
  Administered 2017-10-24: 40 mg via INTRAVENOUS
  Filled 2017-10-23: qty 1

## 2017-10-23 MED ORDER — ATORVASTATIN CALCIUM 20 MG PO TABS
40.0000 mg | ORAL_TABLET | Freq: Every day | ORAL | Status: DC
  Administered 2017-10-24: 40 mg via ORAL
  Filled 2017-10-23: qty 2

## 2017-10-23 MED ORDER — ASPIRIN EC 81 MG PO TBEC
81.0000 mg | DELAYED_RELEASE_TABLET | Freq: Every day | ORAL | Status: DC
  Administered 2017-10-24 – 2017-10-25 (×2): 81 mg via ORAL
  Filled 2017-10-23 (×2): qty 1

## 2017-10-23 MED ORDER — ORAL CARE MOUTH RINSE: 15.0000 mL | Freq: Two times a day (BID) | OROMUCOSAL | Status: DC

## 2017-10-23 MED ORDER — DOXYCYCLINE HYCLATE 100 MG PO TABS
100.0000 mg | ORAL_TABLET | Freq: Once | ORAL | Status: AC
  Administered 2017-10-23: 100 mg via ORAL
  Filled 2017-10-23: qty 1

## 2017-10-23 MED ORDER — METOPROLOL SUCCINATE ER 100 MG PO TB24
200.0000 mg | ORAL_TABLET | Freq: Every day | ORAL | Status: DC
  Administered 2017-10-24 – 2017-10-25 (×2): 200 mg via ORAL
  Filled 2017-10-23: qty 2
  Filled 2017-10-23: qty 4

## 2017-10-23 MED ORDER — FOLIC ACID 1 MG PO TABS
1.0000 mg | ORAL_TABLET | Freq: Every day | ORAL | Status: DC
  Administered 2017-10-23 – 2017-10-24 (×2): 1 mg via ORAL
  Filled 2017-10-23 (×2): qty 1

## 2017-10-23 MED ORDER — ADULT MULTIVITAMIN W/MINERALS CH
1.0000 | ORAL_TABLET | Freq: Every day | ORAL | Status: DC
  Administered 2017-10-24 – 2017-10-25 (×2): 1 via ORAL
  Filled 2017-10-23 (×2): qty 1

## 2017-10-23 MED ORDER — METHYLPREDNISOLONE SODIUM SUCC 40 MG IJ SOLR: 40.0000 mg | Freq: Every day | INTRAMUSCULAR | Status: DC

## 2017-10-23 MED ORDER — PNEUMOCOCCAL VAC POLYVALENT 25 MCG/0.5ML IJ INJ
0.5000 mL | INJECTION | INTRAMUSCULAR | Status: AC
  Administered 2017-10-25: 0.5 mL via INTRAMUSCULAR
  Filled 2017-10-23: qty 0.5

## 2017-10-23 MED ORDER — ACETAMINOPHEN 650 MG RE SUPP: 650.0000 mg | Freq: Four times a day (QID) | RECTAL | Status: DC | PRN

## 2017-10-23 MED ORDER — CHLORHEXIDINE GLUCONATE 0.12 % MT SOLN
15.0000 mL | Freq: Two times a day (BID) | OROMUCOSAL | Status: DC
  Administered 2017-10-23 – 2017-10-24 (×2): 15 mL via OROMUCOSAL
  Filled 2017-10-23 (×2): qty 15

## 2017-10-23 MED ORDER — DEXTROSE 5 % IV SOLN
Freq: Once | INTRAVENOUS | Status: AC
  Administered 2017-10-23: 20:00:00 via INTRAVENOUS
  Filled 2017-10-23 (×2): qty 10

## 2017-10-23 MED ORDER — NITROGLYCERIN 0.4 MG SL SUBL: 0.4000 mg | SUBLINGUAL_TABLET | SUBLINGUAL | Status: DC | PRN

## 2017-10-23 NOTE — ED Notes (Signed)
Family at bedside and will take home patient's belongings.

## 2017-10-23 NOTE — ED Notes (Addendum)
Patient took off the Bipap machine to go to the bathroom. Bipap was replaced and patient used the room commode and was helped back to the stretcher. RT at bedside to readjust Bipap.

## 2017-10-23 NOTE — Consult Note (Signed)
PULMONARY / CRITICAL CARE MEDICINE   Name: Chad Hess MRN: [id number] DOB: 10-Jul-1951    ADMISSION DATE:  10/23/2017   CONSULTATION DATE:  10/23/2017  REFERRING MD:  Dr Leslye Peer  Reason: Acute respiratory failure requiring BiPAP  HISTORY OF PRESENT ILLNESS:   This is a 66 year old Caucasian male with a history of COPD, current smoker who presented to the ED with complaints of shortness of breath.  His SPO2 upon presentation was 70% and he was placed on BiPAP, given albuterol nebulizer and Solu-Medrol as well as magnesium by EMS.  At the ED, was found to be wheezing and tachypneic his chest x-ray was unremarkable.  He is being admitted for acute COPD exacerbation and acute hypoxic respiratory failure  PAST MEDICAL HISTORY :  He  has a past medical history of Atrial fibrillation ([place]), CHF (congestive heart failure) ([place]), Colon polyp, COPD (chronic obstructive pulmonary disease) ([place]), Coronary artery disease, Diverticulitis, ED (erectile dysfunction), GERD (gastroesophageal reflux disease), Hyperlipidemia, Hypertension, PUD (peptic ulcer disease), Pulmonary hypertension ([place]), and Sleep apnea.  PAST SURGICAL HISTORY: He  has a past surgical history that includes Hernia repair (Right); Colonoscopy (12/07/2004); Colonoscopy with propofol (N/A, 02/20/2016); Colonoscopy (N/A, 04/03/2016); and Colonoscopy with propofol (N/A, 04/02/2016).  Allergies  Allergen Reactions  . Chantix [Varenicline]     No current facility-administered medications on file prior to encounter.    Current Outpatient Medications on File Prior to Encounter  Medication Sig  . albuterol (PROVENTIL) (2.5 MG/3ML) 0.083% nebulizer solution Take 2.5 mg by nebulization daily.   Marland Kitchen aspirin 81 MG tablet Take 81 mg by mouth daily.  Marland Kitchen atorvastatin (LIPITOR) 40 MG tablet Take 40 mg by mouth daily.  . furosemide (LASIX) 20 MG tablet Take 1 tablet (20 mg total) by mouth daily.  . metoprolol (TOPROL-XL) 200 MG 24 hr tablet Take 200  mg by mouth daily.  . Multiple Vitamin (MULTIVITAMIN) tablet Take 1 tablet by mouth daily.  Marland Kitchen albuterol (PROVENTIL HFA;VENTOLIN HFA) 108 (90 Base) MCG/ACT inhaler Inhale 2 puffs into the lungs every 4 (four) hours as needed for wheezing or shortness of breath.  . nitroGLYCERIN (NITROSTAT) 0.4 MG SL tablet Place 0.4 mg under the tongue every 5 (five) minutes as needed for chest pain.    FAMILY HISTORY:  His indicated that his mother is deceased.   SOCIAL HISTORY: He  reports that he has been smoking.  He has a 50.00 pack-year smoking history. he has never used smokeless tobacco. He reports that he drinks alcohol. He reports that he does not use drugs.  REVIEW OF SYSTEMS:   Unable to obtain as patient is currently on BiPAP  SUBJECTIVE:   VITAL SIGNS: BP 115/75 (BP Location: Left Arm)   Pulse 85   Temp (!) 97.5 F (36.4 C) (Oral)   Resp (!) 21   Ht 5\' 8"  (1.727 m)   Wt 75.5 kg (166 lb 7.2 oz)   SpO2 100%   BMI 25.31 kg/m   HEMODYNAMICS:    VENTILATOR SETTINGS:    INTAKE / OUTPUT: No intake/output data recorded.  PHYSICAL EXAMINATION: General: Well-nourished, well-developed, in mild to moderate respiratory distress Neuro: Alert and oriented x3 cranial nerves intact HEENT: PERRLA, neck is supple with good range of motion no JVD Cardiovascular: Apical pulse regular, S1-S2 no murmur regurg or gallop, +2 pulses, no edema Lungs: Increased work of breathing, bilateral breath sounds, expiratory wheezes in right lung fields, no rhonchi Abdomen: Nondistended, normal bowel sounds in all 4 quadrants Musculoskeletal: Positive range of  motion in upper and lower extremities no deformities Skin: Warm and dry  LABS:  BMET Recent Labs  Lab 10/23/17 1358  NA 135  K 4.2  CL 99*  CO2 22  BUN 16  CREATININE 1.20  GLUCOSE 159*    Electrolytes Recent Labs  Lab 10/23/17 1358  CALCIUM 9.5    CBC Recent Labs  Lab 10/23/17 1358  WBC 9.9  HGB 16.8  HCT 50.0  PLT 232     Coag's No results for input(s): APTT, INR in the last 168 hours.  Sepsis Markers Recent Labs  Lab 10/23/17 1358  PROCALCITON <0.10    ABG No results for input(s): PHART, PCO2ART, PO2ART in the last 168 hours.  Liver Enzymes No results for input(s): AST, ALT, ALKPHOS, BILITOT, ALBUMIN in the last 168 hours.  Cardiac Enzymes Recent Labs  Lab 10/23/17 1358  TROPONINI <0.03    Glucose No results for input(s): GLUCAP in the last 168 hours.  Imaging Dg Chest Portable 1 View  Result Date: 10/23/2017 CLINICAL DATA:  Shortness of breath, hypoxia. History of COPD, CHF, coronary artery disease, current smoker. EXAM: PORTABLE CHEST 1 VIEW COMPARISON:  Chest x-ray of July 28, 2017 FINDINGS: The lungs remain well-expanded. There is no focal infiltrate. The interstitial markings remain increased bilaterally. The cardiac silhouette is mildly enlarged. The pulmonary vascularity is not engorged. There is calcification in the wall of the aortic arch. IMPRESSION: COPD. No acute pneumonia. Mild cardiomegaly without definite pulmonary vascular congestion. Thoracic aortic atherosclerosis. Electronically Signed   By: David  Martinique M.D.   On: 10/23/2017 14:38    ANTIBIOTICS: Levaquin  SIGNIFICANT EVENTS: 10/23/2017: Admitted  LINES/TUBES: Peripheral IV  DISCUSSION: 66 year old male presenting with acute hypoxemic/hypercarbic respiratory failure, acute on chronic COPD exacerbation and tobacco use disorder  ASSESSMENT Acute hypoxic/hypercarbic respiratory failure Acute on chronic COPD exacerbation Tobacco use disorder Hyperglycemia without a diagnosis of diabetes History of hypertension, hyperlipidemia, CHF(last EF 55%), atrial fibrillation and pulmonary hypertension  PLAN Continuous BiPAP and titrate to nasal cannula as tolerated Inhaled bronchodilators and steroids IV steroids Antibiotics as above Blood glucose monitoring with sliding scale insulin coverage Continue all home  medications GI and DVT prophylaxis  FAMILY  - Updates: Patient updated on current treatment plan  - Inter-disciplinary family meet or Palliative Care meeting due by:  day 7    Seraphim Trow S. [facility] ANP-BC Pulmonary and Critical Care Medicine [facility] Pager [phone] or [phone]  10/23/2017, 5:19 PM

## 2017-10-23 NOTE — Progress Notes (Signed)
RT assisted with patient transport to ICU 13 while on V60 Bipap with no complications, once patient settled into ICU bed, patient taken off bipap and placed on 5l [place]. Will continue to monitor.

## 2017-10-23 NOTE — H&P (Addendum)
[place] [facility] [place] NAME: Chad Hess    MR#:  [id number]  DATE OF BIRTH:  Dec 05, 1950  DATE OF ADMISSION:  10/23/2017  PRIMARY CARE PHYSICIAN: Alanson Aly, FNP   REQUESTING/REFERRING PHYSICIAN: Dr. Rudene Re  CHIEF COMPLAINT:   Chief Complaint  Patient presents with  . Respiratory Distress    HISTORY OF PRESENT ILLNESS:  Chad Hess  is a 66 y.o. male with history of COPD comes in with respiratory distress.  The patient states that he has a history of COPD and he had a doctor's appointment this morning and lots of activity and he developed respiratory symptoms.  Patient is coughing but does not look at the phlegm.  He is wheezing.  He is very short of breath.  In the ER, he was found to have a pulse ox in the 70s and was placed on BiPAP for respiratory distress.  Hospitalist services were contacted for further evaluation.  No fever chills or sweats but positive for fatigue.  Occasional diarrhea.  No complaints of chest pain.  PAST MEDICAL HISTORY:   Past Medical History:  Diagnosis Date  . Atrial fibrillation ([place])   . CHF (congestive heart failure) ([place])   . Colon polyp   . COPD (chronic obstructive pulmonary disease) ([place])   . Coronary artery disease   . Diverticulitis   . ED (erectile dysfunction)   . GERD (gastroesophageal reflux disease)   . Hyperlipidemia   . Hypertension   . PUD (peptic ulcer disease)   . Pulmonary hypertension ([place])   . Sleep apnea    noncompliant with CPAP    PAST SURGICAL HISTORY:   Past Surgical History:  Procedure Laterality Date  . COLONOSCOPY  12/07/2004  . COLONOSCOPY N/A 04/03/2016   Procedure: COLONOSCOPY;  Surgeon: Lollie Sails, MD;  Location: [facility] ENDOSCOPY;  Service: Endoscopy;  Laterality: N/A;  . COLONOSCOPY WITH PROPOFOL N/A 02/20/2016   Procedure: COLONOSCOPY WITH PROPOFOL;  Surgeon: Lollie Sails, MD;  Location: [facility] ENDOSCOPY;  Service: Endoscopy;   Laterality: N/A;  . COLONOSCOPY WITH PROPOFOL N/A 04/02/2016   Procedure: COLONOSCOPY WITH PROPOFOL;  Surgeon: Lollie Sails, MD;  Location: [facility] ENDOSCOPY;  Service: Endoscopy;  Laterality: N/A;  . HERNIA REPAIR Right     SOCIAL HISTORY:   Social History   Tobacco Use  . Smoking status: Current Every Day Smoker    Packs/day: 1.00    Years: 50.00    Pack years: 50.00  . Smokeless tobacco: Never Used  Substance Use Topics  . Alcohol use: Yes    FAMILY HISTORY:   Family History  Problem Relation Age of Onset  . Sudden death Mother   . Epilepsy Mother     DRUG ALLERGIES:   Allergies  Allergen Reactions  . Chantix [Varenicline]     REVIEW OF SYSTEMS:  CONSTITUTIONAL: No fever, chills or sweats.  Positive for fatigue.  EYES: No blurred or double vision.  Wears glasses. EARS, NOSE, AND THROAT: No tinnitus or ear pain. No sore throat.  Decreased hearing.  Positive runny nose. RESPIRATORY: Positive for cough, shortness of breath, and wheezing.  No hemoptysis.  CARDIOVASCULAR: No chest pain, orthopnea, edema.  GASTROINTESTINAL: No nausea, vomiting.  Some diarrhea  and abdominal pain. No blood in bowel movements GENITOURINARY: Occasional dysuria.  No hematuria.  ENDOCRINE: No polyuria, nocturia.  HEMATOLOGY: No anemia, easy bruising or bleeding SKIN: No rash or lesion. MUSCULOSKELETAL: No joint pain or arthritis.   NEUROLOGIC: No  tingling, numbness, weakness.  PSYCHIATRY: No anxiety or depression.   MEDICATIONS AT HOME:   Prior to Admission medications   Medication Sig Start Date End Date Taking? Authorizing Provider  albuterol (PROVENTIL) (2.5 MG/3ML) 0.083% nebulizer solution Take 2.5 mg by nebulization daily.    Yes [provider]  aspirin 81 MG tablet Take 81 mg by mouth daily.   Yes [provider]  atorvastatin (LIPITOR) 40 MG tablet Take 40 mg by mouth daily.   Yes [provider]  furosemide (LASIX) 20 MG tablet Take 1 tablet (20  mg total) by mouth daily. 08/15/17 08/15/18 Yes Hackney, Otila Kluver A, FNP  metoprolol (TOPROL-XL) 200 MG 24 hr tablet Take 200 mg by mouth daily.   Yes [provider]  Multiple Vitamin (MULTIVITAMIN) tablet Take 1 tablet by mouth daily.   Yes [provider]  albuterol (PROVENTIL HFA;VENTOLIN HFA) 108 (90 Base) MCG/ACT inhaler Inhale 2 puffs into the lungs every 4 (four) hours as needed for wheezing or shortness of breath. 08/15/17   Alisa Graff, FNP  nitroGLYCERIN (NITROSTAT) 0.4 MG SL tablet Place 0.4 mg under the tongue every 5 (five) minutes as needed for chest pain.    [provider]      VITAL SIGNS:  Blood pressure 99/71, pulse 75, temperature 98 F (36.7 C), temperature source Oral, resp. rate (!) 24, height 5\' 8"  (1.727 m), weight 79.4 kg (175 lb), SpO2 100 %.  PHYSICAL EXAMINATION:  GENERAL:  66 y.o.-year-old patient lying in the bed with acute respiratory distress.  EYES: Pupils equal, round, reactive to light and accommodation. No scleral icterus. Extraocular muscles intact.  HEENT: Head atraumatic, normocephalic. Oropharynx and nasopharynx clear.  NECK:  Supple, no jugular venous distention. No thyroid enlargement, no tenderness.  LUNGS: Decreased breath sounds bilaterally, poor air entry bilaterally.  Positive for expiratory wheezing.  No rales,rhonchi or crepitation. No use of accessory muscles of respiration.  CARDIOVASCULAR: S1, S2 irregularly irregular. No murmurs, rubs, or gallops.  ABDOMEN: Soft, nontender, nondistended. Bowel sounds present. No organomegaly or mass.  EXTREMITIES:  trace edema.  No atrial fibrillation 82 bpm cyanosis, or clubbing.  NEUROLOGIC: Cranial nerves II through XII are intact. Muscle strength 5/5 in all extremities. Sensation intact. Gait not checked.  PSYCHIATRIC: The patient is alert and oriented x 3.  SKIN: No rash, lesion, or ulcer.   LABORATORY PANEL:   CBC Recent Labs  Lab 10/23/17 1358  WBC 9.9  HGB 16.8   HCT 50.0  PLT 232   ------------------------------------------------------------------------------------------------------------------  Chemistries  Recent Labs  Lab 10/23/17 1358  NA 135  K 4.2  CL 99*  CO2 22  GLUCOSE 159*  BUN 16  CREATININE 1.20  CALCIUM 9.5   ------------------------------------------------------------------------------------------------------------------  Cardiac Enzymes Recent Labs  Lab 10/23/17 1358  TROPONINI <0.03   ------------------------------------------------------------------------------------------------------------------  RADIOLOGY:  Dg Chest Portable 1 View  Result Date: 10/23/2017 CLINICAL DATA:  Shortness of breath, hypoxia. History of COPD, CHF, coronary artery disease, current smoker. EXAM: PORTABLE CHEST 1 VIEW COMPARISON:  Chest x-ray of July 28, 2017 FINDINGS: The lungs remain well-expanded. There is no focal infiltrate. The interstitial markings remain increased bilaterally. The cardiac silhouette is mildly enlarged. The pulmonary vascularity is not engorged. There is calcification in the wall of the aortic arch. IMPRESSION: COPD. No acute pneumonia. Mild cardiomegaly without definite pulmonary vascular congestion. Thoracic aortic atherosclerosis. Electronically Signed   By: David  Martinique M.D.   On: 10/23/2017 14:38    EKG:   Atrial fibrillation  82 bpm  IMPRESSION AND PLAN:   1.  Acute respiratory failure with pulse ox in the 70s on presentation.  Placed on BiPAP.  Case discussed with critical care specialist team Dr. Alva Garnet and APP. 2.  COPD exacerbation.  Start on budesonide and DuoNeb nebulizer solution.  Start Solu-Medrol 40 mg IV daily.  ER physician ordered antibiotics.  Not sure if I need them so I sent off a pro-calcitonin. 3.  Persistent atrial fibrillation.  Aspirin only for anticoagulation because the patient refused anticoagulation as per the last cardiology note by Dr. Ubaldo Glassing.  Patient on a large dose of  Toprol. 4.  Hyperlipidemia unspecified on atorvastatin 5.  History of CAD on aspirin and Toprol.  Looking back at cardiology note from today they are planning to do a cardiac catheterization.  Respiratory status will have to be better prior to doing this. 6.  History of sleep apnea.  Unable to tolerate BiPAP 7.  History of diastolic congestive heart failure but no signs currently 8.  Tobacco abuse.  Smoking cessation counseling done 4 minutes by me.  Nicotine patch ordered. 9.  Impaired fasting glucose.  Check a hemoglobin A1c put on sliding scale 10.  Alcohol abuse.  He drinks 2 drinks a day.  I asked him if he does get shaky if he stops drinking he says sometimes.  I will put on CIWA protocol just in case.    All the records are reviewed and case discussed with ED provider. Management plans discussed with the patient,  and he is in agreement.  CODE STATUS: Full code  TOTAL TIME TAKING CARE OF THIS PATIENT: 50 minutes.  Patient will be admitted to the CCU stepdown secondary to being on BiPAP and respiratory distress.   Loletha Grayer M.D on 10/23/2017 at 3:27 PM  Between 7am to 6pm - Pager - [phone]  After 6pm call admission pager [phone]  [facility]  [phone]  CC: Primary care physician; Alanson Aly, [place]

## 2017-10-23 NOTE — ED Provider Notes (Signed)
[facility] Emergency Department Provider Note  ____________________________________________  Time seen: Approximately 2:24 PM  I have reviewed the triage vital signs and the nursing notes.   HISTORY  Chief Complaint Respiratory Distress   HPI Chad Hess is a 66 y.o. male with h/o HFpEF, COPD, CAD, smoking, HTN, HLD who presents for evaluation of SOB. Marland Kitchen He reports that he has had a cough and SOb for a few days, using inhalers at home. Today started having progressively worsening shortness of breath which has been constant and is now severe. When EMS arrived patient was found to be hypoxic to the 70s and was placed on CPAP. Patient reports wheezing.Denies weight gain or leg swelling, denies leg pain, denies CP, no fever or chills. Patient receive 125mg  solumedrol and 2x duonebs per EMS and reports felling improved on arrival to the ED.   Past Medical History:  Diagnosis Date  . Atrial fibrillation ([place])   . CHF (congestive heart failure) ([place])   . Colon polyp   . COPD (chronic obstructive pulmonary disease) ([place])   . Coronary artery disease   . Diverticulitis   . ED (erectile dysfunction)   . GERD (gastroesophageal reflux disease)   . Hyperlipidemia   . Hypertension   . PUD (peptic ulcer disease)   . Pulmonary hypertension ([place])   . Sleep apnea    noncompliant with CPAP    Patient Active Problem List   Diagnosis Date Noted  . Chronic diastolic heart failure ([place]) 08/15/2017  . Atrial fibrillation ([place]) 08/15/2017  . HTN (hypertension) 08/15/2017  . COPD (chronic obstructive pulmonary disease) ([place]) 08/15/2017  . Tobacco use 08/15/2017  . COPD exacerbation ([place]) 07/26/2017    Past Surgical History:  Procedure Laterality Date  . COLONOSCOPY  12/07/2004  . COLONOSCOPY N/A 04/03/2016   Procedure: COLONOSCOPY;  Surgeon: Lollie Sails, MD;  Location: [facility] ENDOSCOPY;  Service: Endoscopy;  Laterality: N/A;  . COLONOSCOPY WITH PROPOFOL N/A  02/20/2016   Procedure: COLONOSCOPY WITH PROPOFOL;  Surgeon: Lollie Sails, MD;  Location: [facility] ENDOSCOPY;  Service: Endoscopy;  Laterality: N/A;  . COLONOSCOPY WITH PROPOFOL N/A 04/02/2016   Procedure: COLONOSCOPY WITH PROPOFOL;  Surgeon: Lollie Sails, MD;  Location: [facility] ENDOSCOPY;  Service: Endoscopy;  Laterality: N/A;  . HERNIA REPAIR Right     Prior to Admission medications   Medication Sig Start Date End Date Taking? Authorizing Provider  albuterol (PROVENTIL) (2.5 MG/3ML) 0.083% nebulizer solution Take 2.5 mg by nebulization daily.    Yes [provider]  aspirin 81 MG tablet Take 81 mg by mouth daily.   Yes [provider]  atorvastatin (LIPITOR) 40 MG tablet Take 40 mg by mouth daily.   Yes [provider]  furosemide (LASIX) 20 MG tablet Take 1 tablet (20 mg total) by mouth daily. 08/15/17 08/15/18 Yes Hackney, Otila Kluver A, FNP  metoprolol (TOPROL-XL) 200 MG 24 hr tablet Take 200 mg by mouth daily.   Yes [provider]  Multiple Vitamin (MULTIVITAMIN) tablet Take 1 tablet by mouth daily.   Yes [provider]  albuterol (PROVENTIL HFA;VENTOLIN HFA) 108 (90 Base) MCG/ACT inhaler Inhale 2 puffs into the lungs every 4 (four) hours as needed for wheezing or shortness of breath. 08/15/17   Alisa Graff, FNP  nitroGLYCERIN (NITROSTAT) 0.4 MG SL tablet Place 0.4 mg under the tongue every 5 (five) minutes as needed for chest pain.    [provider]    Allergies Chantix [varenicline]  Family History  Problem Relation Age of Onset  . Sudden death Mother     Social History Social History   Tobacco Use  . Smoking status: Current Every Day Smoker    Packs/day: 1.00    Years: 50.00    Pack years: 50.00  . Smokeless tobacco: Never Used  Substance Use Topics  . Alcohol use: Yes  . Drug use: No    Review of Systems  Constitutional: Negative for fever. Eyes: Negative for visual changes. ENT: Negative for sore throat. Neck:  No neck pain  Cardiovascular: Negative for chest pain. Respiratory: + shortness of breath. Gastrointestinal: Negative for abdominal pain, vomiting or diarrhea. Genitourinary: Negative for dysuria. Musculoskeletal: Negative for back pain. Skin: Negative for rash. Neurological: Negative for headaches, weakness or numbness. Psych: No SI or HI  ____________________________________________   PHYSICAL EXAM:  VITAL SIGNS: ED Triage Vitals  Enc Vitals Group     BP 10/23/17 1400 111/70     Pulse Rate 10/23/17 1400 74     Resp 10/23/17 1400 (!) 21     Temp 10/23/17 1400 98 F (36.7 C)     Temp Source 10/23/17 1400 Oral     SpO2 10/23/17 1358 98 %     Weight 10/23/17 1400 175 lb (79.4 kg)     Height 10/23/17 1400 5\' 8"  (1.727 m)     Head Circumference --      Peak Flow --      Pain Score 10/23/17 1358 0     Pain Loc --      Pain Edu? --      Excl. in [place]? --     Constitutional: Alert and oriented. Well appearing and in no apparent distress. HEENT:      Head: Normocephalic and atraumatic.         Eyes: Conjunctivae are normal. Sclera is non-icteric.       Mouth/Throat: Mucous membranes are moist.       Neck: Supple with no signs of meningismus. Cardiovascular: Regular rate and rhythm. No murmurs, gallops, or rubs. 2+ symmetrical distal pulses are present in all extremities. No JVD. Respiratory: increased work of breathing, on CPAP, severely diminished air movement with diffuse expiratory wheezes Gastrointestinal: Soft, non tender, and non distended with positive bowel sounds. No rebound or guarding. Musculoskeletal: Nontender with normal range of motion in all extremities. No edema, cyanosis, or erythema of extremities. Neurologic: Normal speech and language. Face is symmetric. Moving all extremities. No gross focal neurologic deficits are appreciated. Skin: Skin is warm, dry and intact. No rash noted. Psychiatric: Mood and affect are normal. Speech and behavior are  normal.  ____________________________________________   LABS (all labs ordered are listed, but only abnormal results are displayed)  Labs Reviewed  BLOOD GAS, VENOUS - Abnormal; Notable for the following components:      Result Value   pO2, Ven 71.0 (*)    Acid-base deficit 4.0 (*)    All other components within normal limits  BASIC METABOLIC PANEL - Abnormal; Notable for the following components:   Chloride 99 (*)    Glucose, Bld 159 (*)    All other components within normal limits  CBC - Abnormal; Notable for the following components:   MCV 100.1 (*)    RDW 14.6 (*)    All other components within normal limits  TROPONIN I   ____________________________________________  EKG  ED ECG REPORT I, Rudene Re, the attending physician, personally viewed and interpreted this ECG.  Atrial fibrillation, rate of 82,  normal QRS and QTc intervals, normal axis, no ST elevations or depressions. Unchanged from prior. ____________________________________________  RADIOLOGY  CXR: COPD. No acute pneumonia. Mild cardiomegaly without definite pulmonary vascular congestion. Thoracic aortic atherosclerosis.  ____________________________________________   PROCEDURES  Procedure(s) performed: None Procedures Critical Care performed: yes  CRITICAL CARE Performed by: Rudene Re  ?  Total critical care time: 40 min  Critical care time was exclusive of separately billable procedures and treating other patients.  Critical care was necessary to treat or prevent imminent or life-threatening deterioration.  Critical care was time spent personally by me on the following activities: development of treatment plan with patient and/or surrogate as well as nursing, discussions with consultants, evaluation of patient's response to treatment, examination of patient, obtaining history from patient or surrogate, ordering and performing treatments and interventions, ordering and review of  laboratory studies, ordering and review of radiographic studies, pulse oximetry and re-evaluation of patient's condition.  ____________________________________________   INITIAL IMPRESSION / ASSESSMENT AND PLAN / ED COURSE  66 y.o. male with h/o HFpEF, COPD, CAD, smoking, HTN, HLD who presents for evaluation of SOB. patient with severe respiratory distress, increased work of breathing, tachypnea, hypoxia on room air, decreased air movement bilaterally with diffuse expiratory wheezes. Patient has no pitting edema on exam. Patient was placed on BiPAP with duoneb 3. S/p solumedrol and mag per EMS. EKG with no ischemic changes. Labs and CXR pending.    _________________________ 3:09 PM on 10/23/2017 -----------------------------------------  patient feels markedly improved after an hour on BiPAP. Chest x-ray concerning for COPD with no pneumonia however patient will be given antibiotics for bronchitis. VBG within normal limits. Patient will be admitted to ICU on BIPAP.   As part of my medical decision making, I reviewed the following data within the [place] notes reviewed and incorporated, Labs reviewed , EKG interpreted , Old EKG reviewed, Old chart reviewed, Patient signed out to , Radiograph reviewed , Discussed with admitting physician , Notes from prior ED visits and [place] Controlled Substance Database    Pertinent labs & imaging results that were available during my care of the patient were reviewed by me and considered in my medical decision making (see chart for details).    ____________________________________________   FINAL CLINICAL IMPRESSION(S) / ED DIAGNOSES  Final diagnoses:  Acute respiratory failure with hypoxia (HCC)  COPD exacerbation (HCC)      NEW MEDICATIONS STARTED DURING THIS VISIT:  ED Discharge Orders    None       Note:  This document was prepared using Dragon voice recognition software and may include unintentional  dictation errors.    Rudene Re, MD 10/23/17 [phone]

## 2017-10-23 NOTE — ED Notes (Signed)
Patient is alert, denies distress at this time.

## 2017-10-23 NOTE — Progress Notes (Addendum)
Pt arrives to unit on bipap->transitioned to 4LNC- SpO2 >95%, lung sounds clear to auscultation. Pt has remained alert and oriented with no c/o pain. A Fib rate controlled on cardiac monitor - pt states he does not take home medications to control his Afib. He also states that he does not take an anticoagulant and will not d/t PUD. Family has been updated at bedside.  Daughter is POA - wife has dementia. Daughter states, "patient runs out of oxygen with his tank and would like to get a home concentrator. Daughter also states that this pt frequently ambulates without his oxygen and states she would like education regarding the oxygen equipment.

## 2017-10-23 NOTE — ED Triage Notes (Signed)
Pt arrived via ems for c/o shortness of breath - found with O2 sat 70% - placed on bi-pap - given albuterol neb x2, solu medrol 125mg , and Mag 2gm in route by ems - pt has hx of COPD - on arrival MD at bedside for assessment and orders

## 2017-10-24 ENCOUNTER — Other Ambulatory Visit: Payer: Self-pay

## 2017-10-24 DIAGNOSIS — J9601 Acute respiratory failure with hypoxia: Principal | ICD-10-CM

## 2017-10-24 LAB — CBC
HEMATOCRIT: 44.4 % (ref 40.0–52.0)
Hemoglobin: 15.1 g/dL (ref 13.0–18.0)
MCH: 33.7 pg (ref 26.0–34.0)
MCHC: 33.9 g/dL (ref 32.0–36.0)
MCV: 99.2 fL (ref 80.0–100.0)
Platelets: 209 10*3/uL (ref 150–440)
RBC: 4.47 MIL/uL (ref 4.40–5.90)
RDW: 14.8 % — ABNORMAL HIGH (ref 11.5–14.5)
WBC: 8.1 10*3/uL (ref 3.8–10.6)

## 2017-10-24 LAB — GLUCOSE, CAPILLARY
GLUCOSE-CAPILLARY: 238 mg/dL — AB (ref 65–99)
GLUCOSE-CAPILLARY: 141 mg/dL — AB (ref 65–99)
Glucose-Capillary: 149 mg/dL — ABNORMAL HIGH (ref 65–99)
Glucose-Capillary: 141 mg/dL — ABNORMAL HIGH (ref 65–99)
Glucose-Capillary: 124 mg/dL — ABNORMAL HIGH (ref 65–99)

## 2017-10-24 LAB — HEMOGLOBIN A1C
Hgb A1c MFr Bld: 5.6 % (ref 4.8–5.6)
Mean Plasma Glucose: 114.02 mg/dL

## 2017-10-24 LAB — BASIC METABOLIC PANEL
Anion gap: 8 (ref 5–15)
BUN: 21 mg/dL — AB (ref 6–20)
CALCIUM: 8.9 mg/dL (ref 8.9–10.3)
CO2: 23 mmol/L (ref 22–32)
CREATININE: 1.16 mg/dL (ref 0.61–1.24)
Chloride: 101 mmol/L (ref 101–111)
GFR calc Af Amer: >60 mL/min (ref >60)
GLUCOSE: 154 mg/dL — AB (ref 65–99)
Potassium: 4.3 mmol/L (ref 3.5–5.1)
SODIUM: 132 mmol/L — AB (ref 135–145)

## 2017-10-24 LAB — PROCALCITONIN: PROCALCITONIN: 0.19 ng/mL

## 2017-10-24 MED ORDER — ALBUTEROL SULFATE (2.5 MG/3ML) 0.083% IN NEBU: 2.5000 mg | INHALATION_SOLUTION | RESPIRATORY_TRACT | Status: DC | PRN

## 2017-10-24 MED ORDER — SODIUM CHLORIDE 0.9% FLUSH: 3.0000 mL | INTRAVENOUS | Status: DC | PRN

## 2017-10-24 MED ORDER — ASPIRIN 81 MG PO CHEW: 81.0000 mg | CHEWABLE_TABLET | ORAL | Status: DC

## 2017-10-24 MED ORDER — LORAZEPAM 1 MG PO TABS
1.0000 mg | ORAL_TABLET | Freq: Four times a day (QID) | ORAL | Status: DC | PRN
  Administered 2017-10-24 – 2017-10-25 (×3): 1 mg via ORAL
  Filled 2017-10-24 (×3): qty 1

## 2017-10-24 MED ORDER — PREDNISONE 20 MG PO TABS
40.0000 mg | ORAL_TABLET | Freq: Every day | ORAL | Status: DC
  Administered 2017-10-25: 40 mg via ORAL
  Filled 2017-10-24: qty 2

## 2017-10-24 MED ORDER — SODIUM CHLORIDE 0.9 % WEIGHT BASED INFUSION: 3.0000 mL/kg/h | INTRAVENOUS | Status: AC

## 2017-10-24 MED ORDER — SODIUM CHLORIDE 0.9 % IV SOLN: 250.0000 mL | INTRAVENOUS | Status: DC | PRN

## 2017-10-24 MED ORDER — DOXYCYCLINE HYCLATE 100 MG PO TABS
100.0000 mg | ORAL_TABLET | Freq: Two times a day (BID) | ORAL | Status: DC
  Administered 2017-10-24 – 2017-10-25 (×3): 100 mg via ORAL
  Filled 2017-10-24 (×3): qty 1

## 2017-10-24 MED ORDER — SODIUM CHLORIDE 0.9% FLUSH
3.0000 mL | Freq: Two times a day (BID) | INTRAVENOUS | Status: DC
  Administered 2017-10-24 (×2): 3 mL via INTRAVENOUS

## 2017-10-24 MED ORDER — SODIUM CHLORIDE 0.9 % WEIGHT BASED INFUSION: 1.0000 mL/kg/h | INTRAVENOUS | Status: DC

## 2017-10-24 NOTE — Care Management (Signed)
CM was informed that patient was to have a cardiac cath tomorrow. There is an outpatient cath scheduled for 12/11.  Discussed with cardiology that if the cath is not directly related to reason for this admission, it will not be covered by his insurance and CM will have to get patient to sign a HINN for possible non coverage. Cardiology concedes it is not directly related to this admission.   CM explained this to the patient and he states that if it can wait until next week to be covered, he would rather do it then. Updated CM director.

## 2017-10-24 NOTE — Progress Notes (Signed)
SATURATION QUALIFICATIONS: (This note is used to comply with regulatory documentation for home oxygen)  Patient Saturations on Room Air at Rest = 95%  Patient Saturations on Room Air while Ambulating = 87%  Patient Saturations on 2  Liters of oxygen while Ambulating 94%

## 2017-10-24 NOTE — Care Management Note (Signed)
Case Management Note  Patient Details  Name: Chad Hess MRN: [id number] Date of Birth: May 25, 1951  Subjective/Objective:                 Admitted from home to icu stepdown with exac copd requiring continuous bipap.  Chronic home oxygen with [facility] per CM note from 07/2017, but patient says he never changed companies and that it is still with [facility] which is now out of network with his [employer].Marland Kitchen  He was referred to [facility] agency at time of last discharge. [facility] relayed patient did not allow agency to visit. Patient had requested a portable oxygen concentrator but it appears he was not a candidate due to liter flow.  Has home nebulizer machine. Has home cpap that he does not use because the mask does not fit. The mask that is being used in the hospital fits well per patient.  Patient says he declined home health because "I could not afford it."   Action/Plan: Will investigate initiating COPD/PNA [facility] protocol if patient will allow agency to visit. Instructed patient to have family bring his home cpap machine to hospital. Staff instructed to send his current cpap mask/tubing home with patient. [facility] to check into copays for home visits, transfer oxygen to [facility]  Expected Discharge Date:                  Expected Discharge Plan:     In-House Referral:     Discharge planning Services     Post Acute Care Choice:    Choice offered to:     DME Arranged:    DME Agency:     HH Arranged:    HH Agency:     Status of Service:     If discussed at [employer], dates discussed:    Additional Comments:  Katrina Stack, RN 10/24/2017, 8:48 AM

## 2017-10-24 NOTE — Evaluation (Signed)
Physical Therapy Evaluation Patient Details Name: Chad Hess MRN: [id number] DOB: 03-09-1951 Today's Date: 10/24/2017   History of Present Illness  Pt is a 66 y/o M who presented with cough, SOB, wheezing. In the ER his SpO2 was in the 70s and the pt was placed on BiPAP.  Pt placed on CIWA protocol as well.  Pt's PMH includes CHF, COPD, a-fib.      Clinical Impression  Pt admitted with above diagnosis. Mr. Wetmore' BLE strength is [facility] and pt demonstrated no instability with balance testing.  His main limitation is his pulmonary status.  SpO2 down as low as 88% on 2L O2 while ambulating, otherwise VSS. RN notified. Pt is unable to achieve pursed lip breathing while ambulating due to SOB and increased WOB.  No signs of instability with ambulation.  Pt is independent with all aspects of mobility.  Recommending OT Evaluation for energy conservation techniques for greater ease with daily tasks and improved pulmonary endurance.  Encouraged pt to ambulate with the nursing staff at least 3x/day. PT will sign off.  Thank you for this order.     Follow Up Recommendations No PT follow up    Equipment Recommendations  None recommended by PT    Recommendations for Other Services OT consult(Energy conservation techniques)     Precautions / Restrictions Precautions Precautions: Other (comment) Precaution Comments: O2 Restrictions Weight Bearing Restrictions: No      Mobility  Bed Mobility Overal bed mobility: Independent             General bed mobility comments: No physical assist or cues needed.  Pt performs independently.   Transfers Overall transfer level: Independent Equipment used: None             General transfer comment: Pt is steady with no signs of instability.  No physical assist or cues needed.  Ambulation/Gait Ambulation/Gait assistance: Independent Ambulation Distance (Feet): 90 Feet Assistive device: None Gait Pattern/deviations: WFL(Within Functional  Limits)   Gait velocity interpretation: at or above normal speed for age/gender General Gait Details: Pt with steady gait with no signs of instability.  SpO2 down as low as 88% on 2L O2, otherwise VSS.  Pt is unable to achieve pursed lip breathing while ambulating due to SOB and increased WOB.   Stairs            Wheelchair Mobility    Modified Rankin (Stroke Patients Only)       Balance Overall balance assessment: No apparent balance deficits (not formally assessed)                                           Pertinent Vitals/Pain Pain Assessment: No/denies pain    Home Living Family/patient expects to be discharged to:: Private residence Living Arrangements: Spouse/significant other Available Help at Discharge: Family;Available PRN/intermittently Type of Home: House Home Access: Stairs to enter Entrance Stairs-Rails: None Entrance Stairs-Number of Steps: 2 Home Layout: Two level;Able to live on main level with bedroom/bathroom Home Equipment: None      Prior Function Level of Independence: Independent         Comments: Pt reports his wife has dementia so he does all household chores.  His ambulatory distance is limited due to SOB and increased WOB and he has to take rest breaks when ambulating in his home if he is having a bad day.  He does  not ambulate with an AD.  He had a fall onto his courch just PTA due to SOB and fatigue but otherwise has not had any additional falls in the past 6 months.       Hand Dominance        Extremity/Trunk Assessment   Upper Extremity Assessment Upper Extremity Assessment: Defer to OT evaluation    Lower Extremity Assessment Lower Extremity Assessment: Overall WFL for tasks assessed       Communication   Communication: No difficulties  Cognition Arousal/Alertness: Awake/alert Behavior During Therapy: WFL for tasks assessed/performed Overall Cognitive Status: Within Functional Limits for tasks  assessed                                        General Comments General comments (skin integrity, edema, etc.): Balance testing: Tandem stance BLE x30 seconds, Rhomberg stance x30 seconds, Normal stance with eyes closed x30 seconds, marching in place, all with no signs of instability.     Exercises General Exercises - Lower Extremity Ankle Circles/Pumps: AROM;Both;10 reps;Supine Quad Sets: Strengthening;Both;10 reps;Supine Straight Leg Raises: Both;10 reps;Strengthening;Supine   Assessment/Plan    PT Assessment Patent does not need any further PT services  PT Problem List         PT Treatment Interventions      PT Goals (Current goals can be found in the Care Plan section)  Acute Rehab PT Goals Patient Stated Goal: improve his breathing and to go home PT Goal Formulation: All assessment and education complete, DC therapy    Frequency     Barriers to discharge        Co-evaluation               AM-PAC PT "6 Clicks" Daily Activity  Outcome Measure Difficulty turning over in bed (including adjusting bedclothes, sheets and blankets)?: None Difficulty moving from lying on back to sitting on the side of the bed? : None Difficulty sitting down on and standing up from a chair with arms (e.g., wheelchair, bedside commode, etc,.)?: None Help needed moving to and from a bed to chair (including a wheelchair)?: None Help needed walking in hospital room?: None Help needed climbing 3-5 steps with a railing? : None 6 Click Score: 24    End of Session Equipment Utilized During Treatment: Gait belt;Oxygen Activity Tolerance: Patient tolerated treatment well;Treatment limited secondary to medical complications (Comment)(hypoxia) Patient left: in bed;with call bell/phone within reach Nurse Communication: Mobility status;Other (comment)(SpO2) PT Visit Diagnosis: Unsteadiness on feet (R26.81)    Time: 1316-1340 PT Time Calculation (min) (ACUTE ONLY): 24  min   Charges:   PT Evaluation $PT Eval Low Complexity: 1 Low PT Treatments $Gait Training: 8-22 mins   PT G Codes:   PT G-Codes **NOT FOR INPATIENT CLASS** Functional Assessment Tool Used: AM-PAC 6 Clicks Basic Mobility;Clinical judgement Functional Limitation: Mobility: Walking and moving around Mobility: Walking and Moving Around Current Status (W1093): 0 percent impaired, limited or restricted Mobility: Walking and Moving Around Goal Status (A3557): 0 percent impaired, limited or restricted Mobility: Walking and Moving Around Discharge Status (D2202): 0 percent impaired, limited or restricted    Collie Siad PT, DPT 10/24/2017, 1:53 PM

## 2017-10-24 NOTE — Care Management (Addendum)
Daughter brought patient's cpap machine and have obtained the mask patient was using in icu that he says is more comfortable.  Asked unit secretary to contact cardiopulmonary to check the patient's cpap machine.  per daughter patient never received a concentrator in the home- [facility] only provided portable tanks. Primary nurse will perform home oxygen assessment on patient today in order to obtain new oxygen order.  Reviewed home health plan and protocols with patient's daughter and she is is in agreement. Patient will discharge to her home

## 2017-10-24 NOTE — Progress Notes (Signed)
Patients oxygen saturation is 97%, decreased to 2l. Will continue to monitor.

## 2017-10-24 NOTE — Progress Notes (Signed)
SATURATION QUALIFICATIONS: (This note is used to comply with regulatory documentation for home oxygen)  Patient Saturations on Room Air at Rest = 95%  Patient Saturations on Room Air while Ambulating = 87%  Patient Saturations on  Liters of oxygen while Ambulating = %  Please briefly explain why patient needs home oxygen:patient oxygen levels quickly desat while ambulating short distance

## 2017-10-24 NOTE — Progress Notes (Signed)
Much improved No new complaints No distress on [place] O2  Vitals:   10/24/17 1300 10/24/17 1335 10/24/17 1400 10/24/17 1525  BP: (!) 142/114  116/71 122/70  Pulse: 97  83 92  Resp: (!) 26  20 14   Temp:    98.2 F (36.8 C)  TempSrc:    Oral  SpO2: 93% 92% 95% 96%  Weight:    79.2 kg (174 lb 9.6 oz)  Height:    5\' 8"  (1.727 m)    2 LPM   NAD HEENT WNL No JVD Few scattered wheezes RRR, no M BS, soft Extremities warm, no edema No focal neurologic deficits  BMP Latest Ref Rng & Units 10/24/2017 10/23/2017 07/28/2017  Glucose 65 - 99 mg/dL 154(H) 159(H) 166(H)  BUN 6 - 20 mg/dL 21(H) 16 32(H)  Creatinine 0.61 - 1.24 mg/dL 1.16 1.20 1.31(H)  Sodium 135 - 145 mmol/L 132(L) 135 132(L)  Potassium 3.5 - 5.1 mmol/L 4.3 4.2 4.1  Chloride 101 - 111 mmol/L 101 99(L) 97(L)  CO2 22 - 32 mmol/L 23 22 27   Calcium 8.9 - 10.3 mg/dL 8.9 9.5 8.6(L)    CBC Latest Ref Rng & Units 10/24/2017 10/23/2017 07/27/2017  WBC 3.8 - 10.6 K/uL 8.1 9.9 10.0  Hemoglobin 13.0 - 18.0 g/dL 15.1 16.8 15.2  Hematocrit 40.0 - 52.0 % 44.4 50.0 43.1  Platelets 150 - 440 K/uL 209 232 198    No new chest x-ray  IMPRESSION: Acute on chronic hypoxemic respiratory failure COPD exacerbation -much improved Smoker  PLAN/REC: Cont sSupplemental O2 Continue systemic steroids -changed to prednisone  Would complete 5 more doses Continue nebulized steroids and bronchodilators Continue empiric antibiotics -changed to doxycycline.  Complete 5 days Counseled regarding smoking cessation  He is followed by Dr. Raul Del as an outpatient.  After transfer, PCCM will sign off. Please call if we can be of further assistance    Merton Border, MD PCCM service Mobile [phone] Pager [phone] 10/24/2017 5:07 PM

## 2017-10-24 NOTE — Consult Note (Signed)
[facility] Cardiology  CARDIOLOGY CONSULT NOTE  Patient ID: Chad Hess MRN: [id number] DOB/AGE: 66/07/1951 66 y.o.  Admit date: 10/23/2017 Referring Physician [place] Primary Physician Alanson Aly, NP  Primary Cardiologist Fath Reason for Consultation Evaluation for cardiac catheterization  HPI: 66 year old male referred for evaluation of right and left heart catheterization.  The patient has a history of stage III COPD on supplemental oxygen as needed, tobacco abuse, alcohol abuse, obstructive sleep apnea, noncompliant with CPAP, persistent atrial fibrillation, not on chronic anticoagulation per patient wishes, chronic diastolic congestive heart failure, essential hypertension, and hyperlipidemia.  The patient was evaluated by his cardiologist yesterday and reports beginning to feel progressively short of breath and weak after he had left the appointment.  The patient states that he was rushing around too much and overworked himself.  He presented to [facility] ER yesterday afternoon in acute respiratory failure with hypoxia secondary to COPD exacerbation, was placed on BiPAP, 2 duonebs, and solumedrol, with overall improvement, and was admitted to the ICU.  Admission labs notable for troponin less than 0.03.  ECG revealed atrial fibrillation at a rate of 82 bpm with no acute ST or T wave abnormalities.  Chest x-ray revealed COPD, without acute pneumonia, with mild cardiomegaly, without definitive pulmonary vascular congestion.  At a recent office visit with his pulmonologist, Dr. Raul Del, there was question about pulmonary hypertension.  At the visit with his cardiologist yesterday, it was decided to pursue outpatient right and left heart catheterization for further evaluation of the patient's shortness of breath. Currently, the patient reports feeling somewhat better.  He is currently on nasal cannula for supplemental oxygen. He states that his shortness of breath improved after the BiPAP but he feels as  if it is progressively worsening again.  He denies increased peripheral edema.  He denies experiencing palpitations or heart racing.  Review of systems complete and found to be negative unless listed above     Past Medical History:  Diagnosis Date  . Atrial fibrillation ([place])   . CHF (congestive heart failure) ([place])   . Colon polyp   . COPD (chronic obstructive pulmonary disease) ([place])   . Coronary artery disease   . Diverticulitis   . ED (erectile dysfunction)   . GERD (gastroesophageal reflux disease)   . Hyperlipidemia   . Hypertension   . PUD (peptic ulcer disease)   . Pulmonary hypertension ([place])   . Sleep apnea    noncompliant with CPAP    Past Surgical History:  Procedure Laterality Date  . COLONOSCOPY  12/07/2004  . COLONOSCOPY N/A 04/03/2016   Procedure: COLONOSCOPY;  Surgeon: Lollie Sails, MD;  Location: [facility] ENDOSCOPY;  Service: Endoscopy;  Laterality: N/A;  . COLONOSCOPY WITH PROPOFOL N/A 02/20/2016   Procedure: COLONOSCOPY WITH PROPOFOL;  Surgeon: Lollie Sails, MD;  Location: [facility] ENDOSCOPY;  Service: Endoscopy;  Laterality: N/A;  . COLONOSCOPY WITH PROPOFOL N/A 04/02/2016   Procedure: COLONOSCOPY WITH PROPOFOL;  Surgeon: Lollie Sails, MD;  Location[facility] ENDOSCOPY;  Service: Endoscopy;  Laterality: N/A;  . HERNIA REPAIR Right     Medications Prior to Admission  Medication Sig Dispense Refill Last Dose  . albuterol (PROVENTIL) (2.5 MG/3ML) 0.083% nebulizer solution Take 2.5 mg by nebulization daily.    10/23/2017 at 0830  . aspirin 81 MG tablet Take 81 mg by mouth daily.   10/23/2017 at 0830  . atorvastatin (LIPITOR) 40 MG tablet Take 40 mg by mouth daily.   10/23/2017 at Unknown time  . furosemide (LASIX) 20  MG tablet Take 1 tablet (20 mg total) by mouth daily. 30 tablet 5 10/23/2017 at 0830  . metoprolol (TOPROL-XL) 200 MG 24 hr tablet Take 200 mg by mouth daily.   10/23/2017 at Unknown time  . Multiple Vitamin (MULTIVITAMIN) tablet Take 1 tablet by mouth  daily.   10/23/2017 at Unknown time  . albuterol (PROVENTIL HFA;VENTOLIN HFA) 108 (90 Base) MCG/ACT inhaler Inhale 2 puffs into the lungs every 4 (four) hours as needed for wheezing or shortness of breath. 1 Inhaler 5 prn at prn  . nitroGLYCERIN (NITROSTAT) 0.4 MG SL tablet Place 0.4 mg under the tongue every 5 (five) minutes as needed for chest pain.   prn at prn   Social History   Socioeconomic History  . Marital status: Married    Spouse name: Not on file  . Number of children: Not on file  . Years of education: Not on file  . Highest education level: Not on file  Social Needs  . Financial resource strain: Not on file  . Food insecurity - worry: Not on file  . Food insecurity - inability: Not on file  . Transportation needs - medical: Not on file  . Transportation needs - non-medical: Not on file  Occupational History  . Not on file  Tobacco Use  . Smoking status: Current Every Day Smoker    Packs/day: 1.00    Years: 50.00    Pack years: 50.00  . Smokeless tobacco: Never Used  Substance and Sexual Activity  . Alcohol use: Yes  . Drug use: No  . Sexual activity: Not on file  Other Topics Concern  . Not on file  Social History Narrative  . Not on file    Family History  Problem Relation Age of Onset  . Sudden death Mother   . Epilepsy Mother       Review of systems complete and found to be negative unless listed above      PHYSICAL EXAM  General: Well developed, well nourished, in no acute distress, sitting propped up in bed. HEENT:  Normocephalic and atramatic Neck:  supple Lungs: inspiratory wheezing. Normal effort of breathing on supplemental oxygen via nasal cannula. Heart: Irregularly irregular. 2/6 systolic murmur.  Abdomen: Bowel sounds are positive, abdomen soft  Msk:  Back normal, gait not assessed. Normal strength and tone for age. Extremities: No clubbing, cyanosis or edema.   Neuro: Alert and oriented X 3. Psych:  Good affect, responds  appropriately  Labs:   Lab Results  Component Value Date   WBC 8.1 10/24/2017   HGB 15.1 10/24/2017   HCT 44.4 10/24/2017   MCV 99.2 10/24/2017   PLT 209 10/24/2017    Recent Labs  Lab 10/24/17 0346  NA 132*  K 4.3  CL 101  CO2 23  BUN 21*  CREATININE 1.16  CALCIUM 8.9  GLUCOSE 154*   Lab Results  Component Value Date   TROPONINI <0.03 10/23/2017   No results found for: CHOL No results found for: HDL No results found for: LDLCALC No results found for: TRIG No results found for: CHOLHDL No results found for: LDLDIRECT    Radiology: Dg Chest Portable 1 View  Result Date: 10/23/2017 CLINICAL DATA:  Shortness of breath, hypoxia. History of COPD, CHF, coronary artery disease, current smoker. EXAM: PORTABLE CHEST 1 VIEW COMPARISON:  Chest x-ray of July 28, 2017 FINDINGS: The lungs remain well-expanded. There is no focal infiltrate. The interstitial markings remain increased bilaterally. The cardiac silhouette is mildly  enlarged. The pulmonary vascularity is not engorged. There is calcification in the wall of the aortic arch. IMPRESSION: COPD. No acute pneumonia. Mild cardiomegaly without definite pulmonary vascular congestion. Thoracic aortic atherosclerosis. Electronically Signed   By: David  Martinique M.D.   On: 10/23/2017 14:38    EKG: Atrial fibrillation, rate 80 bpm   ASSESSMENT AND PLAN:  1. Acute hypoxic respiratory failure with hypoxia secondary to COPD exacerbation, previously on BiPAP with clinical improvement; still feeling intermittent shortness of breath on nasal cannula. Chest xray revealed COPD without pneumonia with mild cardiomegaly. 2. Persistent atrial fibrillation, rate controlled on metoprolol, minimally symptomatic, not on chronic anticoagulation per patient's wishes. 3. Chronic diastolic CHF, not fluid-overloaded 4. Essential hypertension, blood pressure stable 5. Chronic stage III COPD, on as-needed oxygen supplement, complicated by ongoing tobacco  use.  Recommendations: 1. Agree with overall therapy. 2. Continue metoprolol, aspirin, atorvastatin, furosemide 3. Recommend proceeding with right and left cardiac catheterization as previously discussed with patient as outpatient when patient's respiratory status stabilizes.   Sign off for now; call with any questions.  Signed: Clabe Seal PA-C 10/24/2017, 12:45 PM

## 2017-10-24 NOTE — Progress Notes (Signed)
[place] [facility] [place] NAME: Chad Hess    MR#:  [id number]  DATE OF BIRTH:  1950-12-14  SUBJECTIVE:  CHIEF COMPLAINT:  Patient's shortness of breath is significantly improved. Off BiPAP. Reports he is noncompliant with the CPAP machine at home for sleep apnea  REVIEW OF SYSTEMS:  CONSTITUTIONAL: No fever, fatigue or weakness.  EYES: No blurred or double vision.  EARS, NOSE, AND THROAT: No tinnitus or ear pain.  RESPIRATORY: No cough, improved shortness of breath, denies wheezing or hemoptysis.  CARDIOVASCULAR: No chest pain, orthopnea, edema.  GASTROINTESTINAL: No nausea, vomiting, diarrhea or abdominal pain.  GENITOURINARY: No dysuria, hematuria.  ENDOCRINE: No polyuria, nocturia,  HEMATOLOGY: No anemia, easy bruising or bleeding SKIN: No rash or lesion. MUSCULOSKELETAL: No joint pain or arthritis.   NEUROLOGIC: No tingling, numbness, weakness.  PSYCHIATRY: No anxiety or depression.   DRUG ALLERGIES:   Allergies  Allergen Reactions  . Chantix [Varenicline]     VITALS:  Blood pressure 131/66, pulse 87, temperature 97.9 F (36.6 C), temperature source Oral, resp. rate (!) 27, height 5\' 8"  (1.727 m), weight 75.5 kg (166 lb 7.2 oz), SpO2 95 %.  PHYSICAL EXAMINATION:  GENERAL:  66 y.o.-year-old patient lying in the bed with no acute distress.  EYES: Pupils equal, round, reactive to light and accommodation. No scleral icterus. Extraocular muscles intact.  HEENT: Head atraumatic, normocephalic. Oropharynx and nasopharynx clear.  NECK:  Supple, no jugular venous distention. No thyroid enlargement, no tenderness.  LUNGS: Normal breath sounds bilaterally, no wheezing, rales,rhonchi or crepitation. No use of accessory muscles of respiration.  CARDIOVASCULAR: S1, S2 normal. No murmurs, rubs, or gallops.  ABDOMEN: Soft, nontender, nondistended. Bowel sounds present. No organomegaly or mass.  EXTREMITIES: No pedal edema, cyanosis,  or clubbing.  NEUROLOGIC: Cranial nerves II through XII are intact. Muscle strength 5/5 in all extremities. Sensation intact. Gait not checked.  PSYCHIATRIC: The patient is alert and oriented x 3.  SKIN: No obvious rash, lesion, or ulcer.    LABORATORY PANEL:   CBC Recent Labs  Lab 10/24/17 0346  WBC 8.1  HGB 15.1  HCT 44.4  PLT 209   ------------------------------------------------------------------------------------------------------------------  Chemistries  Recent Labs  Lab 10/24/17 0346  NA 132*  K 4.3  CL 101  CO2 23  GLUCOSE 154*  BUN 21*  CREATININE 1.16  CALCIUM 8.9   ------------------------------------------------------------------------------------------------------------------  Cardiac Enzymes Recent Labs  Lab 10/23/17 1358  TROPONINI <0.03   ------------------------------------------------------------------------------------------------------------------  RADIOLOGY:  Dg Chest Portable 1 View  Result Date: 10/23/2017 CLINICAL DATA:  Shortness of breath, hypoxia. History of COPD, CHF, coronary artery disease, current smoker. EXAM: PORTABLE CHEST 1 VIEW COMPARISON:  Chest x-ray of July 28, 2017 FINDINGS: The lungs remain well-expanded. There is no focal infiltrate. The interstitial markings remain increased bilaterally. The cardiac silhouette is mildly enlarged. The pulmonary vascularity is not engorged. There is calcification in the wall of the aortic arch. IMPRESSION: COPD. No acute pneumonia. Mild cardiomegaly without definite pulmonary vascular congestion. Thoracic aortic atherosclerosis. Electronically Signed   By: David  Martinique M.D.   On: 10/23/2017 14:38    EKG:   Orders placed or performed during the hospital encounter of 10/23/17  . ED EKG within 10 minutes  . ED EKG within 10 minutes    ASSESSMENT AND PLAN:    1. Acute respiratory failure with pulse ox in the 70s on presentation.  Placed on BiPAP at the time of admission  Currently  patient is  off BiPAP  Continue oxygen via nasal cannula and wean off as tolerated  Pulmonology is following    2.  COPD exacerbation.  Start on budesonide and DuoNeb nebulizer solution.    Solu-Medrol 40 mg IV currently will taper to by mouth steroids Pro calcitonin level 0.19    3.  Persistent atrial fibrillation.   Aspirin only for anticoagulation because the patient refused anticoagulation as per the last cardiology note by Dr. Ubaldo Glassing.   Patient on a large dose of Toprol . 4.  Hyperlipidemia unspecified on atorvastatin  5.  History of CAD on aspirin and bb  Looking back at cardiology note from today they are planning to do a cardiac catheterization.  Respiratory status will have to be better prior to doing this.  6.  History of sleep apnea.   noncompliant with the CPAP machine but wants to try here daily at bedtime  7.  History of diastolic congestive heart failure but no signs currently  8.  Tobacco abuse.  Smoking cessation counseling provided  Nicotine patch ordered.  9.  Impaired fasting glucose.  Check a hemoglobin A1c put on sliding scale  10.  Alcohol abuse.  He drinks 2 drinks a day.    on CIWA protocol          All the records are reviewed and case discussed with Care Management/Social Workerr. Management plans discussed with the patient, family and they are in agreement.  CODE STATUS: FC   TOTAL TIME TAKING CARE OF THIS PATIENT: 35  minutes.   POSSIBLE D/C IN 1-2  DAYS, DEPENDING ON CLINICAL CONDITION.  Note: This dictation was prepared with Dragon dictation along with smaller phrase technology. Any transcriptional errors that result from this process are unintentional.   Nicholes Mango M.D on 10/24/2017 at 8:54 AM  Between 7am to 6pm - Pager - [phone] After 6pm go to [email or web address] - password [email or web address] [facility]  Office  [phone]  CC: Primary care physician; Alanson Aly, [place]

## 2017-10-24 NOTE — Evaluation (Signed)
Occupational Therapy Evaluation Patient Details Name: Chad Hess MRN: [id number] DOB: 1951/09/03 Today's Date: 10/24/2017    History of Present Illness Pt is a 66 y/o M who presented with cough, SOB, wheezing. In the ER his SpO2 was in the 70s and the pt was placed on BiPAP.  Pt placed on CIWA protocol as well.  Pt's PMH includes CHF, COPD, a-fib.     Clinical Impression   Pt seen for OT evaluation focused on energy conservation strategies to support pt's SOB. Pt was generally independent at home but limited due to SOB and poor activity tolerance. Pt provides limited caregiving for spouse who has dementia. Pt notes that he used to enjoy fishing but doesn't have the energy for it. Pt also notes his has difficulty filling prescriptions at the drug store "because the pharmacy is usually in the very back of the store which is a really long way to walk." Pt reports at baseline, he uses supplemental O2 on PRN basis when "I need it." Currently pt near baseline for functional independence with ADL, however, poor activity tolerance hinders pt's ability to perform, requiring additional time/effort with SOB. Pt educated in energy conservation strategies including work simplification, AE/DME (such as stool to sit on for showering, raised toilet seat, using a urinal at bedside for overnight bladder mgt), activity pacing, and use of pursed lip breathing. Pt able to return demonstrate use but required cues to utilize during functional mobility tasks. Pt will benefit from skilled OT services to address noted impairments in activity tolerance in order to maximize return to PLOF with decreased SOB and greater confidence and competence in managing O2 at home and minimize SOB, falls, and readmission for acute respiratory failure. Pt will benefit from skilled [facility] OT services to support carryover and implementation of energy conservation strategies in the home as well as education/training on home oxygen use.     Follow  Up Recommendations  Home health OT    Equipment Recommendations  Tub/shower seat;Toilet rise with handles;3 in 1 bedside commode    Recommendations for Other Services       Precautions / Restrictions Precautions Precautions: Other (comment) Precaution Comments: O2, 2L Restrictions Weight Bearing Restrictions: No      Mobility Bed Mobility Overal bed mobility: Independent             General bed mobility comments: No physical assist or cues needed.  Pt performs independently.   Transfers Overall transfer level: Independent Equipment used: None             General transfer comment: Pt is steady with no signs of instability.  No physical assist or cues needed.    Balance Overall balance assessment: No apparent balance deficits (not formally assessed)                                         ADL either performed or assessed with clinical judgement   ADL Overall ADL's : Modified independent                                       General ADL Comments: grossly at baseline functional independence with ADL tasks, however, requires additional time/effort to perform due to poor activity tolerance     Vision Baseline Vision/History: Wears glasses Wears Glasses: At all times  Patient Visual Report: No change from baseline Vision Assessment?: No apparent visual deficits     Perception     Praxis      Pertinent Vitals/Pain Pain Assessment: No/denies pain     Hand Dominance     Extremity/Trunk Assessment Upper Extremity Assessment Upper Extremity Assessment: Overall WFL for tasks assessed   Lower Extremity Assessment Lower Extremity Assessment: Overall WFL for tasks assessed   Cervical / Trunk Assessment Cervical / Trunk Assessment: Normal   Communication Communication Communication: No difficulties   Cognition Arousal/Alertness: Awake/alert Behavior During Therapy: WFL for tasks assessed/performed Overall Cognitive  Status: Within Functional Limits for tasks assessed                                     General Comments      Exercises Other Exercises Other Exercises: Pt educated in energy conservation strategies including work simplification, activity pacing, pursed lip breathing, and home/routines modifications to maximize functional independence and minimize SOB, falls, and injury. Handout provided.   Shoulder Instructions      Home Living Family/patient expects to be discharged to:: Private residence Living Arrangements: Spouse/significant other Available Help at Discharge: Family;Available PRN/intermittently Type of Home: House Home Access: Stairs to enter [employer] of Steps: 2 Entrance Stairs-Rails: None Home Layout: Two level;Able to live on main level with bedroom/bathroom     Bathroom Shower/Tub: Walk-in shower(small shower stall)   Bathroom Toilet: Standard     Home Equipment: None          Prior Functioning/Environment Level of Independence: Independent        Comments: Pt reports his wife has dementia so he does all household chores.  His ambulatory distance is limited due to SOB and increased WOB and he has to take rest breaks when ambulating in his home if he is having a bad day.  He does not ambulate with an AD.  He had a fall onto his courch just PTA due to SOB and fatigue but otherwise has not had any additional falls in the past 6 months.          OT Problem List: Decreased activity tolerance;Decreased knowledge of use of DME or AE      OT Treatment/Interventions: Self-care/ADL training;Energy conservation;Therapeutic activities;DME and/or AE instruction;Patient/family education    OT Goals(Current goals can be found in the care plan section) Acute Rehab OT Goals Patient Stated Goal: improve his breathing and to go home OT Goal Formulation: With patient Time For Goal Achievement: 10/31/17 Potential to Achieve Goals: Good  OT  Frequency: Min 2X/week   Barriers to D/C:            Co-evaluation              AM-PAC PT "6 Clicks" Daily Activity     Outcome Measure Help from another person eating meals?: None Help from another person taking care of personal grooming?: None Help from another person toileting, which includes using toliet, bedpan, or urinal?: A Little Help from another person bathing (including washing, rinsing, drying)?: A Little Help from another person to put on and taking off regular upper body clothing?: None Help from another person to put on and taking off regular lower body clothing?: A Little 6 Click Score: 21   End of Session Equipment Utilized During Treatment: Oxygen(2L O2)  Activity Tolerance: Patient tolerated treatment well Patient left: in bed;with call bell/phone within reach;with nursing/sitter  in room;Other (comment)(2L O2 in place)  OT Visit Diagnosis: Other abnormalities of gait and mobility (R26.89)                Time: 2500-3704 OT Time Calculation (min): 39 min Charges:  OT General Charges $OT Visit: 1 Visit OT Evaluation $OT Eval Low Complexity: 1 Low OT Treatments $Self Care/Home Management : 23-37 mins G-Codes: OT G-codes **NOT FOR INPATIENT CLASS** Functional Assessment Tool Used: AM-PAC 6 Clicks Daily Activity;Clinical judgement Functional Limitation: Self care Self Care Current Status (U8891): At least 1 percent but less than 20 percent impaired, limited or restricted Self Care Goal Status (Q9450): At least 1 percent but less than 20 percent impaired, limited or restricted   Jeni Salles, MPH, MS, OTR/L ascom [phone] 10/24/17, 3:54 PM

## 2017-10-25 ENCOUNTER — Encounter
Admission: EM | Disposition: A | Discharge status: Home / Self Care | Payer: Self-pay | Source: Home / Self Care | Attending: Internal Medicine

## 2017-10-25 LAB — GLUCOSE, CAPILLARY
Glucose-Capillary: 114 mg/dL — ABNORMAL HIGH (ref 65–99)
Glucose-Capillary: 113 mg/dL — ABNORMAL HIGH (ref 65–99)

## 2017-10-25 SURGERY — LEFT HEART CATH AND CORONARY ANGIOGRAPHY
Anesthesia: Moderate Sedation

## 2017-10-25 MED ORDER — ALBUTEROL SULFATE HFA 108 (90 BASE) MCG/ACT IN AERS: 2.0000 | INHALATION_SPRAY | RESPIRATORY_TRACT | 1 refills | Status: AC | PRN

## 2017-10-25 MED ORDER — PREDNISONE 10 MG (21) PO TBPK: 10.0000 mg | ORAL_TABLET | Freq: Every day | ORAL | 0 refills | Status: AC

## 2017-10-25 MED ORDER — ALBUTEROL SULFATE (2.5 MG/3ML) 0.083% IN NEBU: 2.5000 mg | INHALATION_SOLUTION | RESPIRATORY_TRACT | 1 refills | Status: AC | PRN

## 2017-10-25 MED ORDER — ACETAMINOPHEN 325 MG PO TABS: 325.0000 mg | ORAL_TABLET | Freq: Four times a day (QID) | ORAL | Status: AC | PRN

## 2017-10-25 MED ORDER — DOXYCYCLINE HYCLATE 100 MG PO TABS: 100.0000 mg | ORAL_TABLET | Freq: Two times a day (BID) | ORAL | 0 refills | Status: AC

## 2017-10-25 MED ORDER — IPRATROPIUM-ALBUTEROL 0.5-2.5 (3) MG/3ML IN SOLN: 3.0000 mL | Freq: Four times a day (QID) | RESPIRATORY_TRACT | 0 refills | Status: AC | PRN

## 2017-10-25 MED ORDER — NICOTINE 21 MG/24HR TD PT24: 21.0000 mg | MEDICATED_PATCH | Freq: Every day | TRANSDERMAL | 0 refills | Status: AC

## 2017-10-25 NOTE — Progress Notes (Signed)
Pt to be discharged this afternoon. Iv and tele removed. disch instructions and prescrips given to pt. home 02 provided. disch via w,c, accompanied by family

## 2017-10-25 NOTE — Discharge Summary (Signed)
[facility] [place] NAME: Chad Hess    MR#:  [id number]  DATE OF BIRTH:  September 20, 1951  DATE OF ADMISSION:  10/23/2017 ADMITTING PHYSICIAN: Loletha Grayer, MD  DATE OF DISCHARGE: 10/25/17  PRIMARY CARE PHYSICIAN: Alanson Aly, FNP    ADMISSION DIAGNOSIS:  COPD exacerbation ([place]) [J44.1] Acute respiratory failure with hypoxia ([place]) [J96.01]  DISCHARGE DIAGNOSIS:  Active Problems:   Acute respiratory failure with hypoxia (HCC)  Acute COPD SECONDARY DIAGNOSIS:   Past Medical History:  Diagnosis Date  . Atrial fibrillation ([place])   . CHF (congestive heart failure) ([place])   . Colon polyp   . COPD (chronic obstructive pulmonary disease) ([place])   . Coronary artery disease   . Diverticulitis   . ED (erectile dysfunction)   . GERD (gastroesophageal reflux disease)   . Hyperlipidemia   . Hypertension   . PUD (peptic ulcer disease)   . Pulmonary hypertension ([place])   . Sleep apnea    noncompliant with CPAP    HOSPITAL COURSE:   HPI  JesseWilliamsis a66 y.o.malewith history of COPD comes in with respiratory distress. The patient states that he has a history of COPD and he had a doctor's appointment this morning and lots of activity and he developed respiratory symptoms. Patient is coughing but does not look at the phlegm. He is wheezing. He is very short of breath. In the ER, he was found to have a pulse ox in the 70s and was placed on BiPAP for respiratory distress. Hospitalist services were contacted for further evaluation. No fever chills or sweats but positive for fatigue. Occasional diarrhea. No complaints of chest pain.  1.Acute respiratory failure with pulse ox in the 70s on presentation. Placed on BiPAPat the time of admission  Currently patient is off BiPAP  Continue oxygen via nasal cannula and wean off as tolerated  Pulmonology followed  2. COPD exacerbation. Start on budesonide and DuoNeb  nebulizer solution.  Solu-Medrol 40 mg IV currently will taper to by mouth steroids Pro calcitonin level 0.19 Discharge home with by mouth Dr. second for 5 more days  3. Persistent atrial fibrillation.  Aspirin only for anticoagulation because the patient refused anticoagulation as per the last cardiology note by Dr. Ubaldo Glassing.  Patient on a large dose of Toprol Outpatient follow-up with [facility] for cardiac catheterization on 10/29/2017 . 4. Hyperlipidemia unspecified on atorvastatin  5. History of CAD on aspirin andbbLooking back at cardiology note from today they are planning to do a cardiac catheterization.Respiratory status will have to be better prior to doing this.  6. History of sleep apnea.noncompliant with the CPAP machine  but wants to continue  daily at bedtime Op follow up with pulmonology reinforced the importance of being compliant with the CPAP machine  7. History of diastolic congestive heart failure but no signs currently  8. Tobacco abuse. Smoking cessation counselingprovidedNicotine patch ordered.  9. Impaired fasting glucose. 5.6 hemoglobin A1c   10. Alcohol abuse. He drinks 2 drinks a day.  on CIWA protocol during hospital stay     DISCHARGE CONDITIONS:   Fair  CONSULTS OBTAINED:  Treatment Team:  Isaias Cowman, MD   PROCEDURES BiPAP  DRUG ALLERGIES:   Allergies  Allergen Reactions  . Chantix [Varenicline]     DISCHARGE MEDICATIONS:   Allergies as of 10/25/2017      Reactions   Chantix [varenicline]       Medication List    TAKE these medications  acetaminophen 325 MG tablet Commonly known as:  TYLENOL Take 1 tablet (325 mg total) by mouth every 6 (six) hours as needed for mild pain or fever (or Fever >/= 101).   albuterol 108 (90 Base) MCG/ACT inhaler Commonly known as:  PROVENTIL HFA;VENTOLIN HFA Inhale 2 puffs into the lungs every 4 (four) hours as needed for wheezing or shortness of  breath. What changed:  Another medication with the same name was changed. Make sure you understand how and when to take each.   albuterol (2.5 MG/3ML) 0.083% nebulizer solution Commonly known as:  PROVENTIL Take 3 mLs (2.5 mg total) by nebulization every 4 (four) hours as needed for wheezing or shortness of breath. What changed:    when to take this  reasons to take this   aspirin 81 MG tablet Take 81 mg by mouth daily.   atorvastatin 40 MG tablet Commonly known as:  LIPITOR Take 40 mg by mouth daily.   doxycycline 100 MG tablet Commonly known as:  VIBRA-TABS Take 1 tablet (100 mg total) by mouth every 12 (twelve) hours.   furosemide 20 MG tablet Commonly known as:  LASIX Take 1 tablet (20 mg total) by mouth daily.   ipratropium-albuterol 0.5-2.5 (3) MG/3ML Soln Commonly known as:  DUONEB Take 3 mLs by nebulization every 6 (six) hours as needed.   metoprolol 200 MG 24 hr tablet Commonly known as:  TOPROL-XL Take 200 mg by mouth daily.   multivitamin tablet Take 1 tablet by mouth daily.   nicotine 21 mg/24hr patch Commonly known as:  NICODERM CQ - dosed in mg/24 hours Place 1 patch (21 mg total) onto the skin daily. Start taking on:  10/26/2017   nitroGLYCERIN 0.4 MG SL tablet Commonly known as:  NITROSTAT Place 0.4 mg under the tongue every 5 (five) minutes as needed for chest pain.   predniSONE 10 MG (21) Tbpk tablet Commonly known as:  STERAPRED UNI-PAK 21 TAB Take 1 tablet (10 mg total) by mouth daily. Take 6 tablets by mouth for 1 day followed by  5 tablets by mouth for 1 day followed by  4 tablets by mouth for 1 day followed by  3 tablets by mouth for 1 day followed by  2 tablets by mouth for 1 day followed by  1 tablet by mouth for a day and stop            Durable Medical Equipment  (From admission, onward)        Start     Ordered   10/25/17 0909  For home use only DME oxygen  Once    Comments:  Home assessment for portable o2 concentrator   Question Answer Comment  Mode or (Route) Nasal cannula   Liters per Minute 3   Frequency Continuous (stationary and portable oxygen unit needed)   Oxygen conserving device Yes   Oxygen delivery system Gas      10/25/17 0909       DISCHARGE INSTRUCTIONS:   Follow-up with primary care physician in a week  Continue 2 L of oxygen via nasal cannula  Strop drinking alcohol and outpatient follow-up with alcohol anonymous  Follow up with pulmonology in 2-4 weeks     DIET:  Cardiac diet  DISCHARGE CONDITION:  Fair  ACTIVITY:  Activity as tolerated  OXYGEN:  Home Oxygen: Yes.     Oxygen Delivery: 2-3  liters/min via Patient connected to nasal cannula oxygen  DISCHARGE LOCATION:  home   If you experience worsening  of your admission symptoms, develop shortness of breath, life threatening emergency, suicidal or homicidal thoughts you must seek medical attention immediately by calling 911 or calling your MD immediately  if symptoms less severe.  You Must read complete instructions/literature along with all the possible adverse reactions/side effects for all the Medicines you take and that have been prescribed to you. Take any new Medicines after you have completely understood and accpet all the possible adverse reactions/side effects.   Please note  You were cared for by a hospitalist during your hospital stay. If you have any questions about your discharge medications or the care you received while you were in the hospital after you are discharged, you can call the unit and asked to speak with the hospitalist on call if the hospitalist that took care of you is not available. Once you are discharged, your primary care physician will handle any further medical issues. Please note that NO REFILLS for any discharge medications will be authorized once you are discharged, as it is imperative that you return to your primary care physician (or establish a relationship with a primary care  physician if you do not have one) for your aftercare needs so that they can reassess your need for medications and monitor your lab values.     Today  Chief Complaint  Patient presents with  . Respiratory Distress   Patient's shortness of breath improved. Feeling better. Requiring 2-3 L of oxygen while walking    ROS:  CONSTITUTIONAL: Denies fevers, chills. Denies any fatigue, weakness.  EYES: Denies blurry vision, double vision, eye pain. EARS, NOSE, THROAT: Denies tinnitus, ear pain, hearing loss. RESPIRATORY: Significantly improved cough, wheeze, shortness of breath.  CARDIOVASCULAR: Denies chest pain, palpitations, edema.  GASTROINTESTINAL: Denies nausea, vomiting, diarrhea, abdominal pain. Denies bright red blood per rectum. GENITOURINARY: Denies dysuria, hematuria. ENDOCRINE: Denies nocturia or thyroid problems. HEMATOLOGIC AND LYMPHATIC: Denies easy bruising or bleeding. SKIN: Denies rash or lesion. MUSCULOSKELETAL: Denies pain in neck, back, shoulder, knees, hips or arthritic symptoms.  NEUROLOGIC: Denies paralysis, paresthesias.  PSYCHIATRIC: Denies anxiety or depressive symptoms.   VITAL SIGNS:  Blood pressure (!) 150/84, pulse 70, temperature 98 F (36.7 C), resp. rate (!) 22, height 5\' 8"  (1.727 m), weight 77.8 kg (171 lb 9.6 oz), SpO2 99 %.  I/O:    Intake/Output Summary (Last 24 hours) at 10/25/2017 1307 Last data filed at 10/25/2017 0715 Gross per 24 hour  Intake -  Output 1725 ml  Net -1725 ml    PHYSICAL EXAMINATION:  GENERAL:  66 y.o.-year-old patient lying in the bed with no acute distress.  EYES: Pupils equal, round, reactive to light and accommodation. No scleral icterus. Extraocular muscles intact.  HEENT: Head atraumatic, normocephalic. Oropharynx and nasopharynx clear.  NECK:  Supple, no jugular venous distention. No thyroid enlargement, no tenderness.  LUNGS: Normal breath sounds bilaterally, no wheezing, rales,rhonchi or crepitation. No use of  accessory muscles of respiration.  CARDIOVASCULAR: S1, S2 normal. No murmurs, rubs, or gallops.  ABDOMEN: Soft, non-tender, non-distended. Bowel sounds present. No organomegaly or mass.  EXTREMITIES: No pedal edema, cyanosis, or clubbing.  NEUROLOGIC: Cranial nerves II through XII are intact. Muscle strength 5/5 in all extremities. Sensation intact. Gait not checked.  PSYCHIATRIC: The patient is alert and oriented x 3.  SKIN: No obvious rash, lesion, or ulcer.   DATA REVIEW:   CBC Recent Labs  Lab 10/24/17 0346  WBC 8.1  HGB 15.1  HCT 44.4  PLT 209    Chemistries  Recent Labs  Lab 10/24/17 0346  NA 132*  K 4.3  CL 101  CO2 23  GLUCOSE 154*  BUN 21*  CREATININE 1.16  CALCIUM 8.9    Cardiac Enzymes Recent Labs  Lab 10/23/17 1358  [place] <0.03    Microbiology Results  Results for orders placed or performed during the hospital encounter of 10/23/17  MRSA PCR Screening     Status: None   Collection Time: 10/23/17  5:00 PM  Result Value Ref Range Status   MRSA by PCR NEGATIVE NEGATIVE Final    Comment:        The GeneXpert MRSA Assay (FDA approved for NASAL specimens only), is one component of a comprehensive MRSA colonization surveillance program. It is not intended to diagnose MRSA infection nor to guide or monitor treatment for MRSA infections.     RADIOLOGY:  Dg Chest Portable 1 View  Result Date: 10/23/2017 CLINICAL DATA:  Shortness of breath, hypoxia. History of COPD, CHF, coronary artery disease, current smoker. EXAM: PORTABLE CHEST 1 VIEW COMPARISON:  Chest x-ray of July 28, 2017 FINDINGS: The lungs remain well-expanded. There is no focal infiltrate. The interstitial markings remain increased bilaterally. The cardiac silhouette is mildly enlarged. The pulmonary vascularity is not engorged. There is calcification in the wall of the aortic arch. IMPRESSION: COPD. No acute pneumonia. Mild cardiomegaly without definite pulmonary vascular congestion.  Thoracic aortic atherosclerosis. Electronically Signed   By: David  Martinique M.D.   On: 10/23/2017 14:38    EKG:   Orders placed or performed during the hospital encounter of 10/23/17  . ED EKG within 10 minutes  . ED EKG within 10 minutes  . EKG 12-Lead  . EKG 12-Lead      Management plans discussed with the patient, he is in agreement.  CODE STATUS:     Code Status Orders  (From admission, onward)        Start     Ordered   10/23/17 1538  Full code  Continuous     10/23/17 1538    Code Status History    Date Active Date Inactive Code Status Order ID Comments User Context   07/26/2017 16:31 07/29/2017 17:38 Full Code [id number]  Hillary Bow, MD ED      TOTAL TIME TAKING CARE OF THIS PATIENT: 45 minutes.   Note: This dictation was prepared with Dragon dictation along with smaller phrase technology. Any transcriptional errors that result from this process are unintentional.   @MEC @  on 10/25/2017 at 1:07 PM  Between 7am to 6pm - Pager - [phone]  After 6pm go to [email or web address] - password [email or web address] [facility]  Office  [phone]  CC: Primary care physician; Alanson Aly, [place]

## 2017-10-25 NOTE — Discharge Instructions (Signed)
Follow-up with primary care physician in a week  Continue 2 L of oxygen via nasal cannula  Strop drinking alcohol and outpatient follow-up with alcohol anonymous  Follow up with pulmonology in 2-4 weeks

## 2017-10-25 NOTE — Care Management (Signed)
Found that patient did not use his cpap machine last night "because it did not work."  Event organiser with Annia Belt with cardiopulmonary and CM, Tammy and [facility] find that the cpap machine is in working order after clearing the error message and removing the SD card, machine operational.  Attached the mask that patient prefers.  Provided [facility] with daughter jennifer contact information and phone number.  Anderson Malta is to be contacted for all visits and services.

## 2017-10-25 NOTE — Discharge Summary (Deleted)
[facility] [place] NAME: Chad Hess    MR#:  [id number]  DATE OF BIRTH:  [birth year]  DATE OF ADMISSION:  10/23/2017 ADMITTING PHYSICIAN: Loletha Grayer, MD  DATE OF DISCHARGE:  10/25/17  PRIMARY CARE PHYSICIAN: Alanson Aly, FNP    ADMISSION DIAGNOSIS:  COPD exacerbation ([place]) [J44.1] Acute respiratory failure with hypoxia ([place]) [J96.01]  DISCHARGE DIAGNOSIS:  Active Problems:   Acute respiratory failure with hypoxia ([place])   SECONDARY DIAGNOSIS:   Past Medical History:  Diagnosis Date  . Atrial fibrillation ([place])   . CHF (congestive heart failure) ([place])   . Colon polyp   . COPD (chronic obstructive pulmonary disease) ([place])   . Coronary artery disease   . Diverticulitis   . ED (erectile dysfunction)   . GERD (gastroesophageal reflux disease)   . Hyperlipidemia   . Hypertension   . PUD (peptic ulcer disease)   . Pulmonary hypertension ([place])   . Sleep apnea    noncompliant with CPAP    HOSPITAL COURSE:   HPI  Chad Hess  is a 66 y.o. male with history of COPD comes in with respiratory distress.  The patient states that he has a history of COPD and he had a doctor's appointment this morning and lots of activity and he developed respiratory symptoms.  Patient is coughing but does not look at the phlegm.  He is wheezing.  He is very short of breath.  In the ER, he was found to have a pulse ox in the 70s and was placed on BiPAP for respiratory distress.  Hospitalist services were contacted for further evaluation.  No fever chills or sweats but positive for fatigue.  Occasional diarrhea.  No complaints of chest pain.  1.Acute respiratory failure with pulse ox in the 70s on presentation. Placed on BiPAP at the time of admission  Currently patient is off BiPAP  Continue oxygen via nasal cannula and wean off as tolerated  Pulmonology followed   2. COPD exacerbation. Start on budesonide and DuoNeb nebulizer  solution.   Solu-Medrol 40 mg IV currently will taper to by mouth steroids Pro calcitonin level 0.19 Discharge home with by mouth Dr. second for 5 more days   3. Persistent atrial fibrillation.  Aspirin only for anticoagulation because the patient refused anticoagulation as per the last cardiology note by Dr. Ubaldo Glassing.  Patient on a large dose of Toprol Outpatient follow-up with [facility] for cardiac catheterization on 10/29/2017 . 4. Hyperlipidemia unspecified on atorvastatin  5. History of CAD on aspirin and bb Looking back at cardiology note from today they are planning to do a cardiac catheterization.Respiratory status will have to be better prior to doing this.  6. History of sleep apnea.  noncompliant with the CPAP machine  but wants to continue  daily at bedtime Op follow up with pulmonology reinforced the importance of being compliant with the CPAP machine  7. History of diastolic congestive heart failure but no signs currently  8. Tobacco abuse. Smoking cessation counseling provided Nicotine patch ordered.  9. Impaired fasting glucose. 5.6 hemoglobin A1c   10. Alcohol abuse. He drinks 2 drinks a day.   on CIWA protocol during hospital stay    DISCHARGE CONDITIONS:   stable  CONSULTS OBTAINED:  Treatment Team:  Isaias Cowman, MD   PROCEDURES  bipap  DRUG ALLERGIES:   Allergies  Allergen Reactions  . Chantix [Varenicline]     DISCHARGE MEDICATIONS:      DISCHARGE  INSTRUCTIONS:  Follow-up with primary care physician in a week  Continue 2 L of oxygen via nasal cannula  Strop drinking alcohol and outpatient follow-up with alcohol anonymous  Follow up with pulmonology in 2-4 weeks  DIET:  Cardiac diet  DISCHARGE CONDITION:  Fair  ACTIVITY:  Activity as tolerated  OXYGEN:  Home Oxygen: Yes.     Oxygen Delivery: 2-3 liters/min via Patient connected to nasal cannula oxygen  DISCHARGE LOCATION:  home   If you  experience worsening of your admission symptoms, develop shortness of breath, life threatening emergency, suicidal or homicidal thoughts you must seek medical attention immediately by calling 911 or calling your MD immediately  if symptoms less severe.  You Must read complete instructions/literature along with all the possible adverse reactions/side effects for all the Medicines you take and that have been prescribed to you. Take any new Medicines after you have completely understood and accpet all the possible adverse reactions/side effects.   Please note  You were cared for by a hospitalist during your hospital stay. If you have any questions about your discharge medications or the care you received while you were in the hospital after you are discharged, you can call the unit and asked to speak with the hospitalist on call if the hospitalist that took care of you is not available. Once you are discharged, your primary care physician will handle any further medical issues. Please note that NO REFILLS for any discharge medications will be authorized once you are discharged, as it is imperative that you return to your primary care physician (or establish a relationship with a primary care physician if you do not have one) for your aftercare needs so that they can reassess your need for medications and monitor your lab values.     Today  Chief Complaint  Patient presents with  . Respiratory Distress    Patient's shortness of breath improved. Feeling better. Requiring 2-3 L of oxygen while walking  ROS:  CONSTITUTIONAL: Denies fevers, chills. Denies any fatigue, weakness.  EYES: Denies blurry vision, double vision, eye pain. EARS, NOSE, THROAT: Denies tinnitus, ear pain, hearing loss. RESPIRATORY: Significantly improved  cough, wheeze, shortness of breath.  CARDIOVASCULAR: Denies chest pain, palpitations, edema.  GASTROINTESTINAL: Denies nausea, vomiting, diarrhea, abdominal pain. Denies  bright red blood per rectum. GENITOURINARY: Denies dysuria, hematuria. ENDOCRINE: Denies nocturia or thyroid problems. HEMATOLOGIC AND LYMPHATIC: Denies easy bruising or bleeding. SKIN: Denies rash or lesion. MUSCULOSKELETAL: Denies pain in neck, back, shoulder, knees, hips or arthritic symptoms.  NEUROLOGIC: Denies paralysis, paresthesias.  PSYCHIATRIC: Denies anxiety or depressive symptoms.   VITAL SIGNS:  Blood pressure (!) 150/84, pulse 70, temperature 98 F (36.7 C), resp. rate (!) 22, height 5\' 8"  (1.727 m), weight 77.8 kg (171 lb 9.6 oz), SpO2 99 %.  I/O:    Intake/Output Summary (Last 24 hours) at 10/25/2017 1306 Last data filed at 10/25/2017 0715 Gross per 24 hour  Intake -  Output 1725 ml  Net -1725 ml    PHYSICAL EXAMINATION:  GENERAL:  66 y.o.-year-old patient lying in the bed with no acute distress.  EYES: Pupils equal, round, reactive to light and accommodation. No scleral icterus. Extraocular muscles intact.  HEENT: Head atraumatic, normocephalic. Oropharynx and nasopharynx clear.  NECK:  Supple, no jugular venous distention. No thyroid enlargement, no tenderness.  LUNGS: Normal breath sounds bilaterally, no wheezing, rales,rhonchi or crepitation. No use of accessory muscles of respiration.  CARDIOVASCULAR: S1, S2 normal. No murmurs, rubs, or gallops.  ABDOMEN:  Soft, non-tender, non-distended. Bowel sounds present. No organomegaly or mass.  EXTREMITIES: No pedal edema, cyanosis, or clubbing.  NEUROLOGIC: Cranial nerves II through XII are intact. Muscle strength 5/5 in all extremities. Sensation intact. Gait not checked.  PSYCHIATRIC: The patient is alert and oriented x 3.  SKIN: No obvious rash, lesion, or ulcer.   DATA REVIEW:   CBC Recent Labs  Lab 10/24/17 0346  WBC 8.1  HGB 15.1  HCT 44.4  PLT 209    Chemistries  Recent Labs  Lab 10/24/17 0346  NA 132*  K 4.3  CL 101  CO2 23  GLUCOSE 154*  BUN 21*  CREATININE 1.16  CALCIUM 8.9     Cardiac Enzymes Recent Labs  Lab 10/23/17 1358  [place] <0.03    Microbiology Results  Results for orders placed or performed during the hospital encounter of 10/23/17  MRSA PCR Screening     Status: None   Collection Time: 10/23/17  5:00 PM  Result Value Ref Range Status   MRSA by PCR NEGATIVE NEGATIVE Final    Comment:        The GeneXpert MRSA Assay (FDA approved for NASAL specimens only), is one component of a comprehensive MRSA colonization surveillance program. It is not intended to diagnose MRSA infection nor to guide or monitor treatment for MRSA infections.     RADIOLOGY:  Dg Chest Portable 1 View  Result Date: 10/23/2017 CLINICAL DATA:  Shortness of breath, hypoxia. History of COPD, CHF, coronary artery disease, current smoker. EXAM: PORTABLE CHEST 1 VIEW COMPARISON:  Chest x-ray of July 28, 2017 FINDINGS: The lungs remain well-expanded. There is no focal infiltrate. The interstitial markings remain increased bilaterally. The cardiac silhouette is mildly enlarged. The pulmonary vascularity is not engorged. There is calcification in the wall of the aortic arch. IMPRESSION: COPD. No acute pneumonia. Mild cardiomegaly without definite pulmonary vascular congestion. Thoracic aortic atherosclerosis. Electronically Signed   By: David  Martinique M.D.   On: 10/23/2017 14:38    EKG:   Orders placed or performed during the hospital encounter of 10/23/17  . ED EKG within 10 minutes  . ED EKG within 10 minutes  . EKG 12-Lead  . EKG 12-Lead      Management plans discussed with the patient, he is  in agreement.  CODE STATUS:     Code Status Orders  (From admission, onward)        Start     Ordered   10/23/17 1538  Full code  Continuous     10/23/17 1538    Code Status History    Date Active Date Inactive Code Status Order ID Comments User Context   07/26/2017 16:31 07/29/2017 17:38 Full Code [id number]  Hillary Bow, MD ED      TOTAL TIME TAKING CARE  OF THIS PATIENT: 45  minutes.   Note: This dictation was prepared with Dragon dictation along with smaller phrase technology. Any transcriptional errors that result from this process are unintentional.   @M[facility] @  on 10/25/2017 at 1:06 PM  Between 7am to 6pm - Pager - [phone]  After 6pm go to [email or web address] - password [email or web address] [facility]  Office  [phone]  CC: Primary care physician; Alanson Aly, [place]

## 2017-10-28 ENCOUNTER — Other Ambulatory Visit: Payer: Self-pay | Admitting: Cardiology

## 2017-10-29 ENCOUNTER — Ambulatory Visit: Admission: RE | Admit: 2017-10-29 | Payer: Medicare HMO | Source: Ambulatory Visit | Admitting: Cardiology

## 2017-10-29 ENCOUNTER — Encounter: Admission: RE | Payer: Self-pay | Source: Ambulatory Visit

## 2017-10-29 SURGERY — RIGHT/LEFT HEART CATH AND CORONARY ANGIOGRAPHY
Anesthesia: Moderate Sedation | Laterality: Bilateral

## 2017-11-13 ENCOUNTER — Ambulatory Visit: Admission: RE | Admit: 2017-11-13 | Payer: Medicare HMO | Source: Ambulatory Visit | Admitting: Cardiology

## 2017-11-13 ENCOUNTER — Encounter: Admission: RE | Payer: Self-pay | Source: Ambulatory Visit

## 2017-11-13 SURGERY — RIGHT/LEFT HEART CATH AND CORONARY ANGIOGRAPHY
Anesthesia: Moderate Sedation

## 2017-12-02 ENCOUNTER — Other Ambulatory Visit: Payer: Self-pay

## 2017-12-02 ENCOUNTER — Inpatient Hospital Stay
Admission: EM | Admit: 2017-12-02 | Discharge: 2017-12-20 | DRG: 208 | Disposition: E | Payer: Medicare HMO | Attending: Internal Medicine | Admitting: Internal Medicine

## 2017-12-02 ENCOUNTER — Inpatient Hospital Stay: Payer: Medicare HMO

## 2017-12-02 ENCOUNTER — Emergency Department: Payer: Medicare HMO

## 2017-12-02 DIAGNOSIS — J9601 Acute respiratory failure with hypoxia: Secondary | ICD-10-CM | POA: Diagnosis not present

## 2017-12-02 DIAGNOSIS — E872 Acidosis: Secondary | ICD-10-CM | POA: Diagnosis present

## 2017-12-02 DIAGNOSIS — G4733 Obstructive sleep apnea (adult) (pediatric): Secondary | ICD-10-CM | POA: Diagnosis present

## 2017-12-02 DIAGNOSIS — Z515 Encounter for palliative care: Secondary | ICD-10-CM | POA: Diagnosis present

## 2017-12-02 DIAGNOSIS — E875 Hyperkalemia: Secondary | ICD-10-CM | POA: Diagnosis present

## 2017-12-02 DIAGNOSIS — I482 Chronic atrial fibrillation: Secondary | ICD-10-CM | POA: Diagnosis present

## 2017-12-02 DIAGNOSIS — Z66 Do not resuscitate: Secondary | ICD-10-CM | POA: Diagnosis present

## 2017-12-02 DIAGNOSIS — Z9289 Personal history of other medical treatment: Secondary | ICD-10-CM

## 2017-12-02 DIAGNOSIS — Z9119 Patient's noncompliance with other medical treatment and regimen: Secondary | ICD-10-CM

## 2017-12-02 DIAGNOSIS — Z888 Allergy status to other drugs, medicaments and biological substances status: Secondary | ICD-10-CM

## 2017-12-02 DIAGNOSIS — Z0189 Encounter for other specified special examinations: Secondary | ICD-10-CM

## 2017-12-02 DIAGNOSIS — J440 Chronic obstructive pulmonary disease with acute lower respiratory infection: Secondary | ICD-10-CM | POA: Diagnosis present

## 2017-12-02 DIAGNOSIS — N170 Acute kidney failure with tubular necrosis: Secondary | ICD-10-CM | POA: Diagnosis present

## 2017-12-02 DIAGNOSIS — I248 Other forms of acute ischemic heart disease: Secondary | ICD-10-CM | POA: Diagnosis present

## 2017-12-02 DIAGNOSIS — J189 Pneumonia, unspecified organism: Secondary | ICD-10-CM | POA: Diagnosis present

## 2017-12-02 DIAGNOSIS — I130 Hypertensive heart and chronic kidney disease with heart failure and stage 1 through stage 4 chronic kidney disease, or unspecified chronic kidney disease: Secondary | ICD-10-CM | POA: Diagnosis present

## 2017-12-02 DIAGNOSIS — J441 Chronic obstructive pulmonary disease with (acute) exacerbation: Secondary | ICD-10-CM | POA: Diagnosis present

## 2017-12-02 DIAGNOSIS — E785 Hyperlipidemia, unspecified: Secondary | ICD-10-CM | POA: Diagnosis present

## 2017-12-02 DIAGNOSIS — R569 Unspecified convulsions: Secondary | ICD-10-CM | POA: Diagnosis present

## 2017-12-02 DIAGNOSIS — N183 Chronic kidney disease, stage 3 (moderate): Secondary | ICD-10-CM | POA: Diagnosis present

## 2017-12-02 DIAGNOSIS — K219 Gastro-esophageal reflux disease without esophagitis: Secondary | ICD-10-CM | POA: Diagnosis present

## 2017-12-02 DIAGNOSIS — Z9911 Dependence on respirator [ventilator] status: Secondary | ICD-10-CM | POA: Diagnosis not present

## 2017-12-02 DIAGNOSIS — N179 Acute kidney failure, unspecified: Secondary | ICD-10-CM | POA: Diagnosis not present

## 2017-12-02 DIAGNOSIS — Z9981 Dependence on supplemental oxygen: Secondary | ICD-10-CM

## 2017-12-02 DIAGNOSIS — I340 Nonrheumatic mitral (valve) insufficiency: Secondary | ICD-10-CM | POA: Diagnosis not present

## 2017-12-02 DIAGNOSIS — J9600 Acute respiratory failure, unspecified whether with hypoxia or hypercapnia: Secondary | ICD-10-CM

## 2017-12-02 DIAGNOSIS — R739 Hyperglycemia, unspecified: Secondary | ICD-10-CM | POA: Diagnosis present

## 2017-12-02 DIAGNOSIS — I469 Cardiac arrest, cause unspecified: Secondary | ICD-10-CM | POA: Diagnosis present

## 2017-12-02 DIAGNOSIS — G9349 Other encephalopathy: Secondary | ICD-10-CM | POA: Diagnosis present

## 2017-12-02 DIAGNOSIS — I2510 Atherosclerotic heart disease of native coronary artery without angina pectoris: Secondary | ICD-10-CM | POA: Diagnosis present

## 2017-12-02 DIAGNOSIS — Z4659 Encounter for fitting and adjustment of other gastrointestinal appliance and device: Secondary | ICD-10-CM

## 2017-12-02 DIAGNOSIS — J9621 Acute and chronic respiratory failure with hypoxia: Secondary | ICD-10-CM | POA: Diagnosis present

## 2017-12-02 DIAGNOSIS — I272 Pulmonary hypertension, unspecified: Secondary | ICD-10-CM | POA: Diagnosis present

## 2017-12-02 DIAGNOSIS — G931 Anoxic brain damage, not elsewhere classified: Secondary | ICD-10-CM | POA: Diagnosis present

## 2017-12-02 DIAGNOSIS — Z8674 Personal history of sudden cardiac arrest: Secondary | ICD-10-CM

## 2017-12-02 DIAGNOSIS — Z79899 Other long term (current) drug therapy: Secondary | ICD-10-CM

## 2017-12-02 DIAGNOSIS — I5032 Chronic diastolic (congestive) heart failure: Secondary | ICD-10-CM | POA: Diagnosis present

## 2017-12-02 DIAGNOSIS — G253 Myoclonus: Secondary | ICD-10-CM | POA: Diagnosis present

## 2017-12-02 DIAGNOSIS — R34 Anuria and oliguria: Secondary | ICD-10-CM | POA: Diagnosis present

## 2017-12-02 DIAGNOSIS — R000 Tachycardia, unspecified: Secondary | ICD-10-CM | POA: Diagnosis present

## 2017-12-02 DIAGNOSIS — Y95 Nosocomial condition: Secondary | ICD-10-CM | POA: Diagnosis present

## 2017-12-02 DIAGNOSIS — D696 Thrombocytopenia, unspecified: Secondary | ICD-10-CM | POA: Diagnosis present

## 2017-12-02 DIAGNOSIS — J9690 Respiratory failure, unspecified, unspecified whether with hypoxia or hypercapnia: Secondary | ICD-10-CM

## 2017-12-02 DIAGNOSIS — R748 Abnormal levels of other serum enzymes: Secondary | ICD-10-CM | POA: Diagnosis present

## 2017-12-02 DIAGNOSIS — J9620 Acute and chronic respiratory failure, unspecified whether with hypoxia or hypercapnia: Secondary | ICD-10-CM

## 2017-12-02 DIAGNOSIS — F17200 Nicotine dependence, unspecified, uncomplicated: Secondary | ICD-10-CM | POA: Diagnosis present

## 2017-12-02 DIAGNOSIS — Z7982 Long term (current) use of aspirin: Secondary | ICD-10-CM

## 2017-12-02 DIAGNOSIS — I959 Hypotension, unspecified: Secondary | ICD-10-CM | POA: Diagnosis present

## 2017-12-02 LAB — CBC WITH DIFFERENTIAL/PLATELET
BASOS ABS: 0 10*3/uL (ref 0–0.1)
BASOS PCT: 0 %
Band Neutrophils: 2 %
Blasts: 0 %
EOS ABS: 0.2 10*3/uL (ref 0–0.7)
EOS PCT: 1 %
HCT: 44.6 % (ref 40.0–52.0)
HEMOGLOBIN: 14.6 g/dL (ref 13.0–18.0)
LYMPHS ABS: 7.1 10*3/uL — AB (ref 1.0–3.6)
Lymphocytes Relative: 36 %
MCH: 33.1 pg (ref 26.0–34.0)
MCHC: 32.8 g/dL (ref 32.0–36.0)
MCV: 100.8 fL — ABNORMAL HIGH (ref 80.0–100.0)
METAMYELOCYTES PCT: 1 %
MONO ABS: 0.4 10*3/uL (ref 0.2–1.0)
Monocytes Relative: 2 %
Myelocytes: 1 %
NEUTROS PCT: 57 %
Neutro Abs: 12.1 10*3/uL — ABNORMAL HIGH (ref 1.4–6.5)
Other: 0 %
PLATELETS: 179 10*3/uL (ref 150–440)
PROMYELOCYTES ABS: 0 %
RBC: 4.43 MIL/uL (ref 4.40–5.90)
RDW: 14 % (ref 11.5–14.5)
WBC: 19.8 10*3/uL — ABNORMAL HIGH (ref 3.8–10.6)
nRBC: 0 /100 WBC

## 2017-12-02 LAB — COMPREHENSIVE METABOLIC PANEL
ALBUMIN: 3.6 g/dL (ref 3.5–5.0)
ALK PHOS: 90 U/L (ref 38–126)
ALT: 47 U/L (ref 17–63)
AST: 76 U/L — AB (ref 15–41)
Anion gap: 14 (ref 5–15)
BUN: 16 mg/dL (ref 6–20)
CALCIUM: 8.4 mg/dL — AB (ref 8.9–10.3)
CHLORIDE: 103 mmol/L (ref 101–111)
CO2: 26 mmol/L (ref 22–32)
CREATININE: 1.45 mg/dL — AB (ref 0.61–1.24)
GFR calc Af Amer: 56 mL/min — ABNORMAL LOW (ref >60)
GFR calc non Af Amer: 49 mL/min — ABNORMAL LOW (ref >60)
GLUCOSE: 192 mg/dL — AB (ref 65–99)
Potassium: 5.4 mmol/L — ABNORMAL HIGH (ref 3.5–5.1)
SODIUM: 143 mmol/L (ref 135–145)
Total Bilirubin: 0.9 mg/dL (ref 0.3–1.2)
Total Protein: 6.2 g/dL — ABNORMAL LOW (ref 6.5–8.1)

## 2017-12-02 LAB — BLOOD GAS, ARTERIAL
Acid-base deficit: 4.4 mmol/L — ABNORMAL HIGH (ref 0.0–2.0)
BICARBONATE: 24.6 mmol/L (ref 20.0–28.0)
FIO2: 50
MECHANICAL RATE: 18
MECHVT: 500 mL
O2 Saturation: 97.8 %
PEEP: 5 cmH2O
Patient temperature: 37
pCO2 arterial: 60 mmHg — ABNORMAL HIGH (ref 32.0–48.0)
pH, Arterial: 7.22 — ABNORMAL LOW (ref 7.350–7.450)
pO2, Arterial: 119 mmHg — ABNORMAL HIGH (ref 83.0–108.0)

## 2017-12-02 LAB — BRAIN NATRIURETIC PEPTIDE: B Natriuretic Peptide: 295 pg/mL — ABNORMAL HIGH (ref 0.0–100.0)

## 2017-12-02 LAB — GLUCOSE, CAPILLARY: Glucose-Capillary: 135 mg/dL — ABNORMAL HIGH (ref 65–99)

## 2017-12-02 LAB — LACTIC ACID, PLASMA: Lactic Acid, Venous: 6.5 mmol/L (ref 0.5–1.9)

## 2017-12-02 LAB — TROPONIN I: Troponin I: 0.05 ng/mL (ref <0.03)

## 2017-12-02 MED ORDER — NOREPINEPHRINE BITARTRATE 1 MG/ML IV SOLN
0.0000 ug/min | INTRAVENOUS | Status: DC
  Administered 2017-12-02: 2 ug/min via INTRAVENOUS
  Administered 2017-12-03: 5 ug/min via INTRAVENOUS
  Administered 2017-12-04: 8 ug/min via INTRAVENOUS
  Filled 2017-12-02 (×4): qty 4

## 2017-12-02 MED ORDER — FENTANYL 2500MCG IN NS 250ML (10MCG/ML) PREMIX INFUSION
25.0000 ug/h | INTRAVENOUS | Status: DC
  Administered 2017-12-03: 150 ug/h via INTRAVENOUS
  Filled 2017-12-02: qty 250

## 2017-12-02 MED ORDER — MIDAZOLAM HCL 5 MG/5ML IJ SOLN
2.0000 mg | Freq: Once | INTRAMUSCULAR | Status: AC
  Administered 2017-12-02: 2 mg via INTRAVENOUS

## 2017-12-02 MED ORDER — SUCCINYLCHOLINE CHLORIDE 20 MG/ML IJ SOLN
100.0000 mg | Freq: Once | INTRAMUSCULAR | Status: AC
  Administered 2017-12-02: 100 mg via INTRAVENOUS

## 2017-12-02 MED ORDER — ASPIRIN 300 MG RE SUPP
300.0000 mg | RECTAL | Status: AC
  Administered 2017-12-03: 300 mg via RECTAL
  Filled 2017-12-02: qty 1

## 2017-12-02 MED ORDER — PROPOFOL 1000 MG/100ML IV EMUL
5.0000 ug/kg/min | INTRAVENOUS | Status: DC
  Administered 2017-12-02: 80 ug/kg/min via INTRAVENOUS
  Administered 2017-12-03: 60 ug/kg/min via INTRAVENOUS
  Administered 2017-12-03 (×4): 50 ug/kg/min via INTRAVENOUS
  Administered 2017-12-03 – 2017-12-04 (×2): 55 ug/kg/min via INTRAVENOUS
  Administered 2017-12-04: 40 ug/kg/min via INTRAVENOUS
  Administered 2017-12-04: 55 ug/kg/min via INTRAVENOUS
  Filled 2017-12-02 (×9): qty 100

## 2017-12-02 MED ORDER — PROPOFOL 1000 MG/100ML IV EMUL
INTRAVENOUS | Status: AC
  Administered 2017-12-02: 5 ug/kg/min via INTRAVENOUS
  Filled 2017-12-02: qty 100

## 2017-12-02 MED ORDER — ONDANSETRON HCL 4 MG/2ML IJ SOLN: 4.0000 mg | Freq: Four times a day (QID) | INTRAMUSCULAR | Status: DC | PRN

## 2017-12-02 MED ORDER — SODIUM CHLORIDE 0.9% FLUSH
3.0000 mL | Freq: Two times a day (BID) | INTRAVENOUS | Status: DC
  Administered 2017-12-03 – 2017-12-06 (×8): 3 mL via INTRAVENOUS

## 2017-12-02 MED ORDER — FAMOTIDINE IN NACL 20-0.9 MG/50ML-% IV SOLN
20.0000 mg | Freq: Two times a day (BID) | INTRAVENOUS | Status: DC
  Administered 2017-12-03 – 2017-12-04 (×4): 20 mg via INTRAVENOUS
  Filled 2017-12-02 (×4): qty 50

## 2017-12-02 MED ORDER — SODIUM CHLORIDE 0.9 % IV SOLN: INTRAVENOUS | Status: DC

## 2017-12-02 MED ORDER — AMIODARONE HCL IN DEXTROSE 360-4.14 MG/200ML-% IV SOLN
60.0000 mg/h | INTRAVENOUS | Status: DC
  Administered 2017-12-03 (×2): 60 mg/h via INTRAVENOUS
  Filled 2017-12-02 (×2): qty 200

## 2017-12-02 MED ORDER — SODIUM CHLORIDE 0.9 % IV SOLN
INTRAVENOUS | Status: DC
  Administered 2017-12-03: 01:00:00 via INTRAVENOUS

## 2017-12-02 MED ORDER — PROPOFOL 1000 MG/100ML IV EMUL
5.0000 ug/kg/min | Freq: Once | INTRAVENOUS | Status: AC
  Administered 2017-12-02: 40 ug/kg/min via INTRAVENOUS

## 2017-12-02 MED ORDER — SODIUM CHLORIDE 0.9 % IV SOLN
25.0000 ug/h | INTRAVENOUS | Status: DC
  Filled 2017-12-02: qty 50

## 2017-12-02 MED ORDER — LORAZEPAM 2 MG/ML IJ SOLN
2.0000 mg | Freq: Once | INTRAMUSCULAR | Status: AC
  Administered 2017-12-02: 2 mg via INTRAVENOUS

## 2017-12-02 MED ORDER — FENTANYL BOLUS VIA INFUSION
25.0000 ug | INTRAVENOUS | Status: DC | PRN
  Administered 2017-12-03: 25 ug via INTRAVENOUS
  Filled 2017-12-02: qty 25

## 2017-12-02 MED ORDER — ALBUTEROL SULFATE (2.5 MG/3ML) 0.083% IN NEBU
2.5000 mg | INHALATION_SOLUTION | Freq: Four times a day (QID) | RESPIRATORY_TRACT | Status: DC
  Administered 2017-12-03 (×2): 2.5 mg via RESPIRATORY_TRACT
  Filled 2017-12-02 (×2): qty 3

## 2017-12-02 MED ORDER — LORAZEPAM 2 MG/ML IJ SOLN
INTRAMUSCULAR | Status: AC
  Administered 2017-12-02: 2 mg via INTRAVENOUS
  Filled 2017-12-02: qty 1

## 2017-12-02 MED ORDER — VECURONIUM BOLUS VIA INFUSION
10.0000 mg | Freq: Once | INTRAVENOUS | Status: DC
  Filled 2017-12-02: qty 10

## 2017-12-02 MED ORDER — ONDANSETRON HCL 4 MG PO TABS: 4.0000 mg | ORAL_TABLET | Freq: Four times a day (QID) | ORAL | Status: DC | PRN

## 2017-12-02 MED ORDER — AZITHROMYCIN 500 MG IV SOLR
500.0000 mg | INTRAVENOUS | Status: DC
  Filled 2017-12-02: qty 500

## 2017-12-02 MED ORDER — CHLORHEXIDINE GLUCONATE 0.12% ORAL RINSE (MEDLINE KIT)
15.0000 mL | Freq: Two times a day (BID) | OROMUCOSAL | Status: DC
  Administered 2017-12-02 – 2017-12-06 (×8): 15 mL via OROMUCOSAL

## 2017-12-02 MED ORDER — MIDAZOLAM HCL 2 MG/2ML IJ SOLN
4.0000 mg | Freq: Once | INTRAMUSCULAR | Status: AC
  Administered 2017-12-02: 4 mg via INTRAVENOUS

## 2017-12-02 MED ORDER — INSULIN ASPART 100 UNIT/ML ~~LOC~~ SOLN
0.0000 [IU] | SUBCUTANEOUS | Status: DC
  Administered 2017-12-03: 1 [IU] via SUBCUTANEOUS
  Administered 2017-12-03: 2 [IU] via SUBCUTANEOUS
  Administered 2017-12-03: 1 [IU] via SUBCUTANEOUS
  Administered 2017-12-03: 3 [IU] via SUBCUTANEOUS
  Administered 2017-12-03: 2 [IU] via SUBCUTANEOUS
  Administered 2017-12-04 (×3): 1 [IU] via SUBCUTANEOUS
  Administered 2017-12-05: 2 [IU] via SUBCUTANEOUS
  Administered 2017-12-05 – 2017-12-06 (×2): 1 [IU] via SUBCUTANEOUS
  Administered 2017-12-06: 2 [IU] via SUBCUTANEOUS
  Administered 2017-12-06: 1 [IU] via SUBCUTANEOUS
  Filled 2017-12-02 (×12): qty 1

## 2017-12-02 MED ORDER — PROPOFOL 1000 MG/100ML IV EMUL
5.0000 ug/kg/min | Freq: Once | INTRAVENOUS | Status: DC
  Administered 2017-12-02: 5 ug/kg/min via INTRAVENOUS

## 2017-12-02 MED ORDER — ALBUTEROL SULFATE (2.5 MG/3ML) 0.083% IN NEBU
2.5000 mg | INHALATION_SOLUTION | RESPIRATORY_TRACT | Status: DC | PRN
  Administered 2017-12-04: 2.5 mg via RESPIRATORY_TRACT
  Filled 2017-12-02: qty 3

## 2017-12-02 MED ORDER — HEPARIN SODIUM (PORCINE) 5000 UNIT/ML IJ SOLN
5000.0000 [IU] | Freq: Three times a day (TID) | INTRAMUSCULAR | Status: DC
  Administered 2017-12-03 – 2017-12-06 (×11): 5000 [IU] via SUBCUTANEOUS
  Filled 2017-12-02 (×11): qty 1

## 2017-12-02 MED ORDER — PROPOFOL 1000 MG/100ML IV EMUL
INTRAVENOUS | Status: AC
  Administered 2017-12-02: 80 ug/kg/min via INTRAVENOUS
  Filled 2017-12-02: qty 100

## 2017-12-02 MED ORDER — SODIUM CHLORIDE 0.9 % IV SOLN
1000.0000 mg | Freq: Once | INTRAVENOUS | Status: AC
  Administered 2017-12-02: 1000 mg via INTRAVENOUS
  Filled 2017-12-02: qty 10

## 2017-12-02 MED ORDER — MIDAZOLAM HCL 2 MG/2ML IJ SOLN
INTRAMUSCULAR | Status: AC
  Administered 2017-12-02: 4 mg via INTRAVENOUS
  Filled 2017-12-02: qty 4

## 2017-12-02 MED ORDER — FENTANYL CITRATE (PF) 100 MCG/2ML IJ SOLN: 50.0000 ug | Freq: Once | INTRAMUSCULAR | Status: DC

## 2017-12-02 MED ORDER — AMIODARONE HCL IN DEXTROSE 360-4.14 MG/200ML-% IV SOLN
15.0000 mg/h | INTRAVENOUS | Status: DC
  Administered 2017-12-03 (×2): 30 mg/h via INTRAVENOUS
  Administered 2017-12-04: 15 mg/h via INTRAVENOUS
  Filled 2017-12-02 (×2): qty 200

## 2017-12-02 MED ORDER — SODIUM CHLORIDE 0.9% FLUSH: 3.0000 mL | INTRAVENOUS | Status: DC | PRN

## 2017-12-02 MED ORDER — ACETAMINOPHEN 650 MG RE SUPP: 650.0000 mg | Freq: Four times a day (QID) | RECTAL | Status: DC | PRN

## 2017-12-02 MED ORDER — METHYLPREDNISOLONE SODIUM SUCC 125 MG IJ SOLR: 60.0000 mg | Freq: Four times a day (QID) | INTRAMUSCULAR | Status: DC

## 2017-12-02 MED ORDER — SODIUM CHLORIDE 0.9 % IV SOLN
500.0000 mg | Freq: Two times a day (BID) | INTRAVENOUS | Status: DC
  Administered 2017-12-03 (×2): 500 mg via INTRAVENOUS
  Filled 2017-12-02 (×3): qty 5

## 2017-12-02 MED ORDER — ORAL CARE MOUTH RINSE
15.0000 mL | Freq: Four times a day (QID) | OROMUCOSAL | Status: DC
  Administered 2017-12-03 – 2017-12-06 (×14): 15 mL via OROMUCOSAL

## 2017-12-02 MED ORDER — METHYLPREDNISOLONE SODIUM SUCC 40 MG IJ SOLR
40.0000 mg | Freq: Two times a day (BID) | INTRAMUSCULAR | Status: DC
  Administered 2017-12-03: 40 mg via INTRAVENOUS
  Filled 2017-12-02: qty 1

## 2017-12-02 MED ORDER — ACETAMINOPHEN 325 MG PO TABS: 650.0000 mg | ORAL_TABLET | Freq: Four times a day (QID) | ORAL | Status: DC | PRN

## 2017-12-02 MED ORDER — ENOXAPARIN SODIUM 40 MG/0.4ML ~~LOC~~ SOLN: 40.0000 mg | SUBCUTANEOUS | Status: DC

## 2017-12-02 MED ORDER — SODIUM CHLORIDE 0.9 % IV SOLN: 250.0000 mL | INTRAVENOUS | Status: DC | PRN

## 2017-12-02 MED ORDER — DOXYCYCLINE HYCLATE 100 MG IV SOLR
100.0000 mg | Freq: Two times a day (BID) | INTRAVENOUS | Status: DC
  Administered 2017-12-03: 100 mg via INTRAVENOUS
  Filled 2017-12-02 (×2): qty 100

## 2017-12-02 NOTE — ED Notes (Signed)
Family at bedside. 

## 2017-12-02 NOTE — ED Notes (Signed)
Pt having jerking motions and appears to be arousing and fighting tube

## 2017-12-02 NOTE — H&P (Addendum)
[facility] NAME: Chad Hess    MR#:  [id number]  DATE OF BIRTH:  01/07/1951  DATE OF ADMISSION:  11/28/2017  PRIMARY CARE PHYSICIAN: Alanson Aly, FNP   REQUESTING/REFERRING PHYSICIAN: Dr. Rip Harbour  CHIEF COMPLAINT:   Chief Complaint  Patient presents with  . Cardiac Arrest   Cardiac arrest today. HISTORY OF PRESENT ILLNESS:  Chad Hess  is a 67 y.o. male with a known history of multiple medical problems as below.  The history is limited since the patient is intubated.  According to ED physician, the patient has worsening shortness of breath at home.  When the EMS arrived the patient was found cardiac arrest.  CPR was started by EMS.  The patient was given 3 doses of IV in the 1 dose of bicarb.  CPR was performed 27 minutes and the pulses were regained.  But the patient had a tonic-clonic jerking movement throughout his body.  He was given Versed and seizure stopped.  The EKG show A. fib with RVR at 108 in the ED.  CAT scan of the head is unremarkable.  Chest x-ray did not show any pulmonary edema or infiltrate.  PAST MEDICAL HISTORY:   Past Medical History:  Diagnosis Date  . Atrial fibrillation ([place])   . CHF (congestive heart failure) ([place])   . Colon polyp   . COPD (chronic obstructive pulmonary disease) ([place])   . Coronary artery disease   . Diverticulitis   . ED (erectile dysfunction)   . GERD (gastroesophageal reflux disease)   . Hyperlipidemia   . Hypertension   . PUD (peptic ulcer disease)   . Pulmonary hypertension ([place])   . Sleep apnea    noncompliant with CPAP    PAST SURGICAL HISTORY:   Past Surgical History:  Procedure Laterality Date  . COLONOSCOPY  12/07/2004  . COLONOSCOPY N/A 04/03/2016   Procedure: COLONOSCOPY;  Surgeon: Lollie Sails, MD;  Location: [facility] ENDOSCOPY;  Service: Endoscopy;  Laterality: N/A;  . COLONOSCOPY WITH PROPOFOL N/A 02/20/2016   Procedure: COLONOSCOPY WITH PROPOFOL;   Surgeon: Lollie Sails, MD;  Location: [facility] ENDOSCOPY;  Service: Endoscopy;  Laterality: N/A;  . COLONOSCOPY WITH PROPOFOL N/A 04/02/2016   Procedure: COLONOSCOPY WITH PROPOFOL;  Surgeon: Lollie Sails, MD;  Location: [facility] ENDOSCOPY;  Service: Endoscopy;  Laterality: N/A;  . HERNIA REPAIR Right     SOCIAL HISTORY:   Social History   Tobacco Use  . Smoking status: Current Every Day Smoker    Packs/day: 1.00    Years: 50.00    Pack years: 50.00  . Smokeless tobacco: Never Used  Substance Use Topics  . Alcohol use: Yes    FAMILY HISTORY:   Family History  Problem Relation Age of Onset  . Sudden death Mother   . Epilepsy Mother     DRUG ALLERGIES:   Allergies  Allergen Reactions  . Chantix [Varenicline]     REVIEW OF SYSTEMS:   Review of Systems  Unable to perform ROS: Intubated    MEDICATIONS AT HOME:   Prior to Admission medications   Medication Sig Start Date End Date Taking? Authorizing Provider  acetaminophen (TYLENOL) 325 MG tablet Take 1 tablet (325 mg total) by mouth every 6 (six) hours as needed for mild pain or fever (or Fever >/= 101). 10/25/17   Gouru, Illene Silver, MD  albuterol (PROVENTIL HFA;VENTOLIN HFA) 108 (90 Base) MCG/ACT inhaler Inhale 2 puffs into the lungs every 4 (four)  hours as needed for wheezing or shortness of breath. 10/25/17   Gouru, Illene Silver, MD  albuterol (PROVENTIL) (2.5 MG/3ML) 0.083% nebulizer solution Take 3 mLs (2.5 mg total) by nebulization every 4 (four) hours as needed for wheezing or shortness of breath. 10/25/17   Nicholes Mango, MD  aspirin 81 MG tablet Take 81 mg by mouth daily.    [provider]  atorvastatin (LIPITOR) 40 MG tablet Take 40 mg by mouth daily.    [provider]  doxycycline (VIBRA-TABS) 100 MG tablet Take 1 tablet (100 mg total) by mouth every 12 (twelve) hours. 10/25/17   Nicholes Mango, MD  furosemide (LASIX) 20 MG tablet Take 1 tablet (20 mg total) by mouth daily. 08/15/17 08/15/18  Darylene Price A,  FNP  ipratropium-albuterol (DUONEB) 0.5-2.5 (3) MG/3ML SOLN Take 3 mLs by nebulization every 6 (six) hours as needed. 10/25/17   Nicholes Mango, MD  metoprolol (TOPROL-XL) 200 MG 24 hr tablet Take 200 mg by mouth daily.    [provider]  Multiple Vitamin (MULTIVITAMIN) tablet Take 1 tablet by mouth daily.    [provider]  nicotine (NICODERM CQ - DOSED IN MG/24 HOURS) 21 mg/24hr patch Place 1 patch (21 mg total) onto the skin daily. 10/26/17   Nicholes Mango, MD  nitroGLYCERIN (NITROSTAT) 0.4 MG SL tablet Place 0.4 mg under the tongue every 5 (five) minutes as needed for chest pain.    [provider]  predniSONE (STERAPRED UNI-PAK 21 TAB) 10 MG (21) TBPK tablet Take 1 tablet (10 mg total) by mouth daily. Take 6 tablets by mouth for 1 day followed by  5 tablets by mouth for 1 day followed by  4 tablets by mouth for 1 day followed by  3 tablets by mouth for 1 day followed by  2 tablets by mouth for 1 day followed by  1 tablet by mouth for a day and stop 10/25/17   Nicholes Mango, MD      VITAL SIGNS:  SpO2 100 %.  PHYSICAL EXAMINATION:  Physical Exam  GENERAL:  67 y.o.-year-old patient lying in the bed on intubation and ventilation.  Obesity. EYES: Pupils equal, pinpoint, round, reactive to light. No scleral icterus. Extraocular muscles intact.  HEENT: Head atraumatic, normocephalic.  NECK:  Supple, no jugular venous distention. No thyroid enlargement, no tenderness.  LUNGS: Normal breath sounds bilaterally, moderate wheezing, no rales,rhonchi or crepitation. No use of accessory muscles of respiration.  CARDIOVASCULAR: S1, S2 normal. No murmurs, rubs, or gallops.  ABDOMEN: Soft, nontender, nondistended. Bowel sounds present. No organomegaly or mass.  EXTREMITIES: No pedal edema, cyanosis, or clubbing.  NEUROLOGIC: Unable to exam PSYCHIATRIC: The patient is intubated and on ventilation. SKIN: No obvious rash, lesion, or ulcer.   LABORATORY PANEL:   CBC No  results for input(s): WBC, HGB, HCT, PLT in the last 168 hours. ------------------------------------------------------------------------------------------------------------------  Chemistries  Recent Labs  Lab 11/22/2017 1750  NA 143  K 5.4*  CL 103  CO2 26  GLUCOSE 192*  BUN 16  CREATININE 1.45*  CALCIUM 8.4*  AST 76*  ALT 47  ALKPHOS 90  BILITOT 0.9   ------------------------------------------------------------------------------------------------------------------  Cardiac Enzymes Recent Labs  Lab 11/28/2017 1750  TROPONINI 0.05*   ------------------------------------------------------------------------------------------------------------------  RADIOLOGY:  Ct Head Wo Contrast  Result Date: 12/01/2017 CLINICAL DATA:  Seizure, CPR EXAM: CT HEAD WITHOUT CONTRAST TECHNIQUE: Contiguous axial images were obtained from the base of the skull through the vertex without intravenous contrast. COMPARISON:  None. FINDINGS: Brain: No acute territorial  infarction, hemorrhage or intracranial mass is visualized. Mild asymmetric hypodensity in the left pons and cerebellar peduncle, suspected to be secondary to streak artifact given appearance on coronal and sagittal views. The ventricles are nonenlarged. Mild dilatation of anterior extra-axial CSF spaces. Vascular: No hyperdense vessels.  Carotid artery calcification Skull: No fracture.  Small fluid in the right mastoid Sinuses/Orbits: Fluid levels in the sphenoid sinuses. Mucosal thickening in the ethmoid sinuses. No acute orbital abnormality Other: None IMPRESSION: No definite CT evidence for acute intracranial abnormality. Electronically Signed   By: Donavan Foil M.D.   On: 11/22/2017 18:31   Dg Chest Portable 1 View  Result Date: 12/01/2017 CLINICAL DATA:  Post intubation and CPR. EXAM: PORTABLE CHEST 1 VIEW COMPARISON:  10/23/2017; 07/28/2017; 07/26/2017 FINDINGS: Grossly unchanged borderline enlarged cardiac silhouette and mediastinal  contours with atherosclerotic plaque within the thoracic aorta. There is persistent thickening the right paratracheal stripe, presumably secondary to prominent vasculature. Endotracheal tube overlies the tracheal air column with tip approximately 3.5 cm above the carina. Epicardial pacer leads overlie the cardiac apex. No supine evidence of pneumothorax or pleural effusion. Overall improved aeration of the lungs. No focal airspace opacities. No evidence of edema. No acute osseus abnormalities. IMPRESSION: 1. Appropriately positioned support apparatus. No supine evidence of pneumothorax. 2. No focal airspace opacities or evidence of edema. Electronically Signed   By: Sandi Mariscal M.D.   On: 12/11/2017 18:16      IMPRESSION AND PLAN:   Cardiac arrest and acute respiratory failure The patient will be admitted to ICU.  Continue ventilation and sedation, follow-up intensivist consult.  Cardiology consult.  Seizure activity, possible due to cardiac arrest. Continue sedation.  Continue Keppra IV, neurology consult.  Acute respiratory failure with hypoxia due to COPD exacerbation. Start IV Solu-Medrol and DuoNeb.  A. fib with RVR.  Follow-up cardiology consult.  Hyperkalemia.  Start normal saline IV and follow-up level.  Acute renal failure.  IV fluid support.  Follow-up BMP.  Lactic acidosis.  Treatment as above, follow-up lactic acid level.  Elevated troponin due to demanding ischemia.  History of chronic diastolic CHF.  Chest x-ray did not show any pulmonary edema.  Discussed with Dr. Alva Garnet. All the records are reviewed and case discussed with ED provider. Management plans discussed with the patient, family and they are in agreement.  CODE STATUS: Full code  TOTAL CRITICAL TIME TAKING CARE OF THIS PATIENT: 57 minutes.    Demetrios Loll M.D on 12/05/2017 at 6:51 PM  Between 7am to 6pm - Pager - [phone]  After 6pm go to [email or web address] - Proofreader  [facility] [place] [facility]  Hospitalists  Office  [phone]  CC: Primary care physician; Alanson Aly, FNP   Note: This dictation was prepared with Dragon dictation along with smaller phrase technology. Any transcriptional errors that result from this process are unin
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
## 2017-12-02 NOTE — ED Provider Notes (Signed)
[facility] Emergency Department Provider Note   ____________________________________________   First MD Initiated Contact with Patient 11/19/2017 1745     (approximate)  I have reviewed the triage vital signs and the nursing notes.   HISTORY  Chief Complaint Cardiac Arrest Chief complaint CPR in progress  History limited by patient's unresponsiveness  HPI Chad Hess is a 67 y.o. male per EMS patient was at home getting a neb treatment 5 department arrived and patient arrested and follow them. CPR was started by them EMS gave 3 epis and 1 bicarbonate after 27 minutes of CPR pulses were regained patient got one and a half liters of fluid en route CBG was I believe 135. Here CBG was repeated and again was in the 130s. Patient had a King airway in. He was put onto the stretcher and then began having some seizures. Tonic-clonic jerking movements throughout his whole body. He was given 2 of Versed and another 2 of Versed seizures stopped he remained in A. fib with a good blood pressure then gave him some succinylcholine and intubated him under direct vision patient had some more seizing we gave him some more Versed CT was done I did not see any acute bleed or anything like that on the CT scan and chest x-ray was also done and showed the ET tube in good position no pneumonia no CHF I spoke to the family they're waiting to come visit him.   Past Medical History:  Diagnosis Date  . Atrial fibrillation ([place])   . CHF (congestive heart failure) ([place])   . Colon polyp   . COPD (chronic obstructive pulmonary disease) ([place])   . Coronary artery disease   . Diverticulitis   . ED (erectile dysfunction)   . GERD (gastroesophageal reflux disease)   . Hyperlipidemia   . Hypertension   . PUD (peptic ulcer disease)   . Pulmonary hypertension ([place])   . Sleep apnea    noncompliant with CPAP    Patient Active Problem List   Diagnosis Date Noted  . Cardiac arrest ([place])  12/16/2017  . Acute respiratory failure with hypoxia ([place]) 10/23/2017  . Chronic diastolic heart failure ([place]) 08/15/2017  . Atrial fibrillation ([place]) 08/15/2017  . HTN (hypertension) 08/15/2017  . COPD (chronic obstructive pulmonary disease) ([place]) 08/15/2017  . Tobacco use 08/15/2017  . COPD exacerbation ([place]) 07/26/2017    Past Surgical History:  Procedure Laterality Date  . COLONOSCOPY  12/07/2004  . COLONOSCOPY N/A 04/03/2016   Procedure: COLONOSCOPY;  Surgeon: Lollie Sails, MD;  Location: [facility] ENDOSCOPY;  Service: Endoscopy;  Laterality: N/A;  . COLONOSCOPY WITH PROPOFOL N/A 02/20/2016   Procedure: COLONOSCOPY WITH PROPOFOL;  Surgeon: Lollie Sails, MD;  Location: [facility] ENDOSCOPY;  Service: Endoscopy;  Laterality: N/A;  . COLONOSCOPY WITH PROPOFOL N/A 04/02/2016   Procedure: COLONOSCOPY WITH PROPOFOL;  Surgeon: Lollie Sails, MD;  Location: [facility] ENDOSCOPY;  Service: Endoscopy;  Laterality: N/A;  . HERNIA REPAIR Right     Prior to Admission medications   Medication Sig Start Date End Date Taking? Authorizing Provider  acetaminophen (TYLENOL) 325 MG tablet Take 1 tablet (325 mg total) by mouth every 6 (six) hours as needed for mild pain or fever (or Fever >/= 101). 10/25/17   Gouru, Illene Silver, MD  albuterol (PROVENTIL HFA;VENTOLIN HFA) 108 (90 Base) MCG/ACT inhaler Inhale 2 puffs into the lungs every 4 (four) hours as needed for wheezing or shortness of breath. 10/25/17   Nicholes Mango, MD  albuterol (  PROVENTIL) (2.5 MG/3ML) 0.083% nebulizer solution Take 3 mLs (2.5 mg total) by nebulization every 4 (four) hours as needed for wheezing or shortness of breath. 10/25/17   Nicholes Mango, MD  aspirin 81 MG tablet Take 81 mg by mouth daily.    [provider]  atorvastatin (LIPITOR) 40 MG tablet Take 40 mg by mouth daily.    [provider]  doxycycline (VIBRA-TABS) 100 MG tablet Take 1 tablet (100 mg total) by mouth every 12 (twelve) hours. 10/25/17   Nicholes Mango, MD    furosemide (LASIX) 20 MG tablet Take 1 tablet (20 mg total) by mouth daily. 08/15/17 08/15/18  Darylene Price A, FNP  ipratropium-albuterol (DUONEB) 0.5-2.5 (3) MG/3ML SOLN Take 3 mLs by nebulization every 6 (six) hours as needed. 10/25/17   Nicholes Mango, MD  metoprolol (TOPROL-XL) 200 MG 24 hr tablet Take 200 mg by mouth daily.    [provider]  Multiple Vitamin (MULTIVITAMIN) tablet Take 1 tablet by mouth daily.    [provider]  nicotine (NICODERM CQ - DOSED IN MG/24 HOURS) 21 mg/24hr patch Place 1 patch (21 mg total) onto the skin daily. 10/26/17   Nicholes Mango, MD  nitroGLYCERIN (NITROSTAT) 0.4 MG SL tablet Place 0.4 mg under the tongue every 5 (five) minutes as needed for chest pain.    [provider]  predniSONE (STERAPRED UNI-PAK 21 TAB) 10 MG (21) TBPK tablet Take 1 tablet (10 mg total) by mouth daily. Take 6 tablets by mouth for 1 day followed by  5 tablets by mouth for 1 day followed by  4 tablets by mouth for 1 day followed by  3 tablets by mouth for 1 day followed by  2 tablets by mouth for 1 day followed by  1 tablet by mouth for a day and stop 10/25/17   Nicholes Mango, MD    Allergies Chantix [varenicline]  Family History  Problem Relation Age of Onset  . Sudden death Mother   . Epilepsy Mother     Social History Social History   Tobacco Use  . Smoking status: Current Every Day Smoker    Packs/day: 1.00    Years: 50.00    Pack years: 50.00  . Smokeless tobacco: Never Used  Substance Use Topics  . Alcohol use: Yes  . Drug use: No    Review of Systems unable to obtain  ____________________________________________   PHYSICAL EXAM:  VITAL SIGNS: ED Triage Vitals [11/28/2017 1800]  Enc Vitals Group     BP      Pulse      Resp      Temp      Temp src      SpO2 100 %     Weight      Height      Head Circumference      Peak Flow      Pain Score      Pain Loc      Pain Edu?      Excl. in [place]?     Constitutional: intubated  initially moving his eyes and having spontaneous respirations then began seizings. Eyes: Conjunctivae are injected Head: Atraumatic. Nose: No congestion/rhinnorhea. Mouth/Throat: Mucous membranes are moist.  Oropharynx non-erythematous. Neck: No stridor.on intubation appeared to be a lot of polyps around the vocal cords and above the vocal cords   Cardiovascular: Normal rate, regular rhythm. Grossly normal heart sounds.  Good peripheral circulation. Respiratory: Normal respiratory effort.  No retractions. Lungs CTAB. Gastrointestinal: Soft and  nontender. No distention. No abdominal bruits. No CVA tenderness. Musculoskeletal: No lower extremity tenderness nor edema.  No joint effusions. Neurologic:  Normal speech and language. No gross focal neurologic deficits are appreciated. No gait instability. Skin:  Skin is warm, dry and intact. No rash noted.  ____________________________________________   LABS (all labs ordered are listed, but only abnormal results are displayed)  Labs Reviewed  COMPREHENSIVE METABOLIC PANEL - Abnormal; Notable for the following components:      Result Value   Potassium 5.4 (*)    Glucose, Bld 192 (*)    Creatinine, Ser 1.45 (*)    Calcium 8.4 (*)    Total Protein 6.2 (*)    AST 76 (*)    GFR calc non Af Amer 49 (*)    GFR calc Af Amer 56 (*)    All other components within normal limits  TROPONIN I - Abnormal; Notable for the following components:   Troponin I 0.05 (*)    All other components within normal limits  LACTIC ACID, PLASMA - Abnormal; Notable for the following components:   Lactic Acid, Venous 6.5 (*)    All other components within normal limits  GLUCOSE, CAPILLARY - Abnormal; Notable for the following components:   Glucose-Capillary 135 (*)    All other components within normal limits  CBC WITH DIFFERENTIAL/PLATELET  LACTIC ACID, PLASMA  BRAIN NATRIURETIC PEPTIDE   ____________________________________________  EKG  EKG read and  interpreted by me showed A. fib at a rate of 108 normal axis no acute changes ____________________________________________  RADIOLOGY  Ct Head Wo Contrast  Result Date: 11/19/2017 CLINICAL DATA:  Seizure, CPR EXAM: CT HEAD WITHOUT CONTRAST TECHNIQUE: Contiguous axial images were obtained from the base of the skull through the vertex without intravenous contrast. COMPARISON:  None. FINDINGS: Brain: No acute territorial infarction, hemorrhage or intracranial mass is visualized. Mild asymmetric hypodensity in the left pons and cerebellar peduncle, suspected to be secondary to streak artifact given appearance on coronal and sagittal views. The ventricles are nonenlarged. Mild dilatation of anterior extra-axial CSF spaces. Vascular: No hyperdense vessels.  Carotid artery calcification Skull: No fracture.  Small fluid in the right mastoid Sinuses/Orbits: Fluid levels in the sphenoid sinuses. Mucosal thickening in the ethmoid sinuses. No acute orbital abnormality Other: None IMPRESSION: No definite CT evidence for acute intracranial abnormality. Electronically Signed   By: Donavan Foil M.D.   On: 12/13/2017 18:31   Dg Chest Portable 1 View  Result Date: 11/24/2017 CLINICAL DATA:  Post intubation and CPR. EXAM: PORTABLE CHEST 1 VIEW COMPARISON:  10/23/2017; 07/28/2017; 07/26/2017 FINDINGS: Grossly unchanged borderline enlarged cardiac silhouette and mediastinal contours with atherosclerotic plaque within the thoracic aorta. There is persistent thickening the right paratracheal stripe, presumably secondary to prominent vasculature. Endotracheal tube overlies the tracheal air column with tip approximately 3.5 cm above the carina. Epicardial pacer leads overlie the cardiac apex. No supine evidence of pneumothorax or pleural effusion. Overall improved aeration of the lungs. No focal airspace opacities. No evidence of edema. No acute osseus abnormalities. IMPRESSION: 1. Appropriately positioned support apparatus. No  supine evidence of pneumothorax. 2. No focal airspace opacities or evidence of edema. Electronically Signed   By: Sandi Mariscal M.D.   On: 11/27/2017 18:16   CT of the head is showing no acute problems chest x-ray as well ET tube in good position ____________________________________________   PROCEDURES  Procedure(s) performed: patient sedated with Versed given 100 of succinylcholine IV and then intubated with the glide scope. Patient did  well. Color change was good. Good bilateral breath sounds and no sounds in the stomach.  Procedures  Critical Care performed: critical care time including speaking with EMS running the code watching the patient in CT scan speaking with the family and talking to the hospitalist 1 hour ____________________________________________   INITIAL IMPRESSION / [place] / ED COURSE  patient given Versed to control seizures and a gram of Keppra IV. Patient is no longer seizing. At least there are no clinically evident seizures. It is on the ventilator hospitalist has come down to assume care of the patient at this point.     ____________________________________________   FINAL CLINICAL IMPRESSION(S) / ED DIAGNOSES  Final diagnoses:  Successful cardiopulmonary resuscitation     ED Discharge Orders    None       Note:  This document was prepared using Dragon voice recognition software and may include unintentional dictation errors.    Nena Polio, MD 11/27/2017 [phone]

## 2017-12-02 NOTE — Progress Notes (Signed)
Pt was transported to CT from the ED and back while on the vent.
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
## 2017-12-02 NOTE — ED Notes (Signed)
VO given by admitting MD for propofol

## 2017-12-02 NOTE — ED Notes (Signed)
Pharmacy called to send levophed

## 2017-12-02 NOTE — ED Notes (Signed)
RT called to assist with transport of pt

## 2017-12-02 NOTE — ED Notes (Signed)
Pt having jerking positions and opening eyes - appears to be fighting ET tube

## 2017-12-02 NOTE — Consult Note (Signed)
PULMONARY / CRITICAL CARE MEDICINE   Name: Chad Hess MRN: [id number] DOB: [birth year]    ADMISSION DATE:  11/25/2017 CONSULTATION DATE: 12/15/2017  REFERRING MD: Dr. Bridgett Larsson   CHIEF COMPLAINT: Cardiac Arrest   HISTORY OF PRESENT ILLNESS:   This is a 67 yo male with a PMH of OSA (noncompliant with CPAP), Pulmonary HTN, GERD, HTN, Hyperlipidemia, ED, Diverticulitis, CAD, COPD, CHF, and Atrial Fibrillation.  He presented to [facility] ER via EMS 01/14 post respiratory and cardiac arrest.  According to pts family he had developed upper respiratory symptoms that he thought was a cold a few days prior to presentation to the ER.  On 01/14 it was noted he developed worsening shortness of breath.  EMS notified by a neighbor and upon arrival of the fire department and EMS the pt was having agonal respirations with a nebulized machine laying beside him. He developed PEA arrest and received a total of 4 epinephrine and 1 sodium bicarb with an estimated downtime of 30 mins before ROSC.  A king airway was placed by EMS and he received 1.5L fluid bolus en route to the ER.  Upon arrival to the ER the pt developed tonic clonic jerking movements throughout his entire body and received a total of 4 mg iv versed and 1 gram keppra jerking movements subsided.  He was subsequently mechanically intubated in the ER and initial cardiac rhythm was atrial fibrillation.  CT Head obtained results negative for acute intracranial abnormality, therefore hypothermic protocol initiated by PCCM team.  He was subsequently admitted to ICU by hospitalist team for further workup and treatment.    PAST MEDICAL HISTORY :  He  has a past medical history of Atrial fibrillation ([place]), CHF (congestive heart failure) ([place]), Colon polyp, COPD (chronic obstructive pulmonary disease) ([place]), Coronary artery disease, Diverticulitis, ED (erectile dysfunction), GERD (gastroesophageal reflux disease), Hyperlipidemia, Hypertension, PUD (peptic ulcer disease),  Pulmonary hypertension ([place]), and Sleep apnea.  PAST SURGICAL HISTORY: He  has a past surgical history that includes Hernia repair (Right); Colonoscopy (12/07/2004); Colonoscopy with propofol (N/A, 02/20/2016); Colonoscopy (N/A, 04/03/2016); and Colonoscopy with propofol (N/A, 04/02/2016).  Allergies  Allergen Reactions  . Chantix [Varenicline]     No current facility-administered medications on file prior to encounter.    Current Outpatient Medications on File Prior to Encounter  Medication Sig  . aspirin 81 MG tablet Take 81 mg by mouth daily.  Marland Kitchen atorvastatin (LIPITOR) 40 MG tablet Take 40 mg by mouth daily.  . furosemide (LASIX) 20 MG tablet Take 1 tablet (20 mg total) by mouth daily.  . metoprolol (TOPROL-XL) 200 MG 24 hr tablet Take 200 mg by mouth daily.  Marland Kitchen acetaminophen (TYLENOL) 325 MG tablet Take 1 tablet (325 mg total) by mouth every 6 (six) hours as needed for mild pain or fever (or Fever >/= 101).  Marland Kitchen albuterol (PROVENTIL HFA;VENTOLIN HFA) 108 (90 Base) MCG/ACT inhaler Inhale 2 puffs into the lungs every 4 (four) hours as needed for wheezing or shortness of breath.  Marland Kitchen albuterol (PROVENTIL) (2.5 MG/3ML) 0.083% nebulizer solution Take 3 mLs (2.5 mg total) by nebulization every 4 (four) hours as needed for wheezing or shortness of breath.  . doxycycline (VIBRA-TABS) 100 MG tablet Take 1 tablet (100 mg total) by mouth every 12 (twelve) hours. (Patient not taking: Reported on 12/15/2017)  . ipratropium-albuterol (DUONEB) 0.5-2.5 (3) MG/3ML SOLN Take 3 mLs by nebulization every 6 (six) hours as needed.  . nicotine (NICODERM CQ - DOSED IN MG/24 HOURS) 21 mg/24hr patch  Place 1 patch (21 mg total) onto the skin daily.  . nitroGLYCERIN (NITROSTAT) 0.4 MG SL tablet Place 0.4 mg under the tongue every 5 (five) minutes as needed for chest pain.  . predniSONE (STERAPRED UNI-PAK 21 TAB) 10 MG (21) TBPK tablet Take 1 tablet (10 mg total) by mouth daily. Take 6 tablets by mouth for 1 day followed by  5  tablets by mouth for 1 day followed by  4 tablets by mouth for 1 day followed by  3 tablets by mouth for 1 day followed by  2 tablets by mouth for 1 day followed by  1 tablet by mouth for a day and stop (Patient not taking: Reported on 12/16/2017)    FAMILY HISTORY:  His indicated that his mother is deceased.   SOCIAL HISTORY: He  reports that he has been smoking.  He has a 50.00 pack-year smoking history. he has never used smokeless tobacco. He reports that he drinks alcohol. He reports that he does not use drugs.  REVIEW OF SYSTEMS:   Unable to assess pt intubated   SUBJECTIVE:  Unable to assess pt intubated   VITAL SIGNS: BP 118/81   Pulse 76   Resp 19   Ht 5\' 11"  (1.803 m)   Wt 108.9 kg (240 lb)   SpO2 100%   BMI 33.47 kg/m   HEMODYNAMICS:    VENTILATOR SETTINGS: Vent Mode: AC FiO2 (%):  [50 %-100 %] 50 % Set Rate:  [18 bmp] 18 bmp Vt Set:  [500 mL] 500 mL PEEP:  [5 cmH20] 5 cmH20  INTAKE / OUTPUT: I/O last 3 completed shifts: In: 100 [IV Piggyback:100] Out: -   PHYSICAL EXAMINATION: General: acutely ill appearing male, mechanically intubated NAD Neuro: sedated not following commands, right pupil 1 mm sluggish, left pupil 2 mm sluggish and reddened sclera   HEENT: supple, no JVD Cardiovascular: irregular irregular, rate controlled Lungs: rhonchi throughout, even, non labored  Abdomen: +BS x4, obese, soft, non tender, non distended  Musculoskeletal: normal bulk and tone, no edema  Skin: intact no rashes or lesions   LABS:  BMET Recent Labs  Lab 12/18/2017 1750  NA 143  K 5.4*  CL 103  CO2 26  BUN 16  CREATININE 1.45*  GLUCOSE 192*    Electrolytes Recent Labs  Lab 11/19/2017 1750  CALCIUM 8.4*    CBC Recent Labs  Lab 11/19/2017 1750  WBC 19.8*  HGB 14.6  HCT 44.6  PLT 179    Coag's No results for input(s): APTT, INR in the last 168 hours.  Sepsis Markers Recent Labs  Lab 11/29/2017 1750  LATICACIDVEN 6.5*    ABG No results for  input(s): PHART, PCO2ART, PO2ART in the last 168 hours.  Liver Enzymes Recent Labs  Lab 11/25/2017 1750  AST 76*  ALT 47  ALKPHOS 90  BILITOT 0.9  ALBUMIN 3.6    Cardiac Enzymes Recent Labs  Lab 11/28/2017 1750  TROPONINI 0.05*    Glucose Recent Labs  Lab 12/16/2017 1807  GLUCAP 135*    Imaging Ct Head Wo Contrast  Result Date: 11/28/2017 CLINICAL DATA:  Seizure, CPR EXAM: CT HEAD WITHOUT CONTRAST TECHNIQUE: Contiguous axial images were obtained from the base of the skull through the vertex without intravenous contrast. COMPARISON:  None. FINDINGS: Brain: No acute territorial infarction, hemorrhage or intracranial mass is visualized. Mild asymmetric hypodensity in the left pons and cerebellar peduncle, suspected to be secondary to streak artifact given appearance on coronal and sagittal views. The ventricles  are nonenlarged. Mild dilatation of anterior extra-axial CSF spaces. Vascular: No hyperdense vessels.  Carotid artery calcification Skull: No fracture.  Small fluid in the right mastoid Sinuses/Orbits: Fluid levels in the sphenoid sinuses. Mucosal thickening in the ethmoid sinuses. No acute orbital abnormality Other: None IMPRESSION: No definite CT evidence for acute intracranial abnormality. Electronically Signed   By: Donavan Foil M.D.   On: 12/18/2017 18:31   Dg Chest Portable 1 View  Result Date: 12/01/2017 CLINICAL DATA:  Post intubation and CPR. EXAM: PORTABLE CHEST 1 VIEW COMPARISON:  10/23/2017; 07/28/2017; 07/26/2017 FINDINGS: Grossly unchanged borderline enlarged cardiac silhouette and mediastinal contours with atherosclerotic plaque within the thoracic aorta. There is persistent thickening the right paratracheal stripe, presumably secondary to prominent vasculature. Endotracheal tube overlies the tracheal air column with tip approximately 3.5 cm above the carina. Epicardial pacer leads overlie the cardiac apex. No supine evidence of pneumothorax or pleural effusion.  Overall improved aeration of the lungs. No focal airspace opacities. No evidence of edema. No acute osseus abnormalities. IMPRESSION: 1. Appropriately positioned support apparatus. No supine evidence of pneumothorax. 2. No focal airspace opacities or evidence of edema. Electronically Signed   By: Sandi Mariscal M.D.   On: 12/03/2017 18:16   STUDIES:  CT Head 01/14>>No definite CT evidence for acute intracranial abnormality  CULTURES: Blood x2 01/14>> Urine 01/14>> Respiratory 01/14>>  ANTIBIOTICS: Cefepime 01/14>>  SIGNIFICANT EVENTS: 01/14-Pt admitted to ICU post cardiac arrest 36 C hypothermia protocol initiated   LINES/TUBES: ETT 01/14>>  ASSESSMENT / PLAN:  PULMONARY A: Acute on chronic hypoxic respiratory failure likely secondary to AECOPD  Mechanical Intubation  Hx: OSA and Pulmonary HTN P:   Full vent support for now-vent settings established Hypothermic protocol @ 36 C SBT once hypothermia protocol completed  Repeat CXR in am  ABG's per hypothermia protocol  VAP bundle implemented   CARDIOVASCULAR A:  PEA Arrest  Hypotension  Chronic Atrial Fibrillation Mildly elevated troponin's Hx: CAD and Hyperlipidemia  P:  Continuous telemetry monitoring Prn levophed gtt to maintain map >65 Echo pending  Cardiology consulted appreciate input   RENAL A:   Acute renal failure with hyperkalemia secondary to hypotension Lactic acidosis  P:   Trend BMP  Replace electrolytes as indicated  Monitor UOP Trend lactic acid   GASTROINTESTINAL A:   No acute issues  P:   Pepcid for SUP Keep NPO for now   HEMATOLOGIC A:   No acute issues  P:  Trend CBC Subq heparin for VTE prophylaxis  Monitor for s/sx of bleeding  Transfuse per usual guidelines  INFECTIOUS A:   Leukocytosis  P:   Trend WBC and monitor fever curve Trend PCT and lactic acid  Follow cultures  Will start azithromycin  ENDOCRINE A:   Hyperglycemia  P:   CBG's q4hrs and SSI    NEUROLOGIC A:   Acute encephalopathy post cardiac arrest ?Seizure Activity  Mechanical Intubation  P:   RASS goal: -1 during cooling process  Propofol and fentanyl gtts to maintain RASS goal  WUA once hypothermia protocol completed  EEG pending  Neurology consulted appreciate input    FAMILY  - Updates: Pts family updated regarding plan of care and all questions answered.   - Inter-disciplinary family meet or Palliative Care meeting due by: 12/09/2017   Marda Stalker, [place] Pager [phone] (please enter 7 digits) [place] Pager [phone] (please enter 7 digits)

## 2017-12-02 NOTE — ED Triage Notes (Signed)
See blank note 

## 2017-12-03 ENCOUNTER — Other Ambulatory Visit: Payer: Self-pay

## 2017-12-03 ENCOUNTER — Inpatient Hospital Stay: Payer: Medicare HMO

## 2017-12-03 ENCOUNTER — Inpatient Hospital Stay (HOSPITAL_COMMUNITY)
Admit: 2017-12-03 | Discharge: 2017-12-03 | Disposition: A | Discharge status: Home / Self Care | Payer: Medicare HMO | Attending: Critical Care Medicine | Admitting: Critical Care Medicine

## 2017-12-03 ENCOUNTER — Inpatient Hospital Stay (HOSPITAL_COMMUNITY): Payer: Medicare HMO

## 2017-12-03 DIAGNOSIS — I469 Cardiac arrest, cause unspecified: Secondary | ICD-10-CM

## 2017-12-03 DIAGNOSIS — G931 Anoxic brain damage, not elsewhere classified: Secondary | ICD-10-CM

## 2017-12-03 DIAGNOSIS — J9620 Acute and chronic respiratory failure, unspecified whether with hypoxia or hypercapnia: Secondary | ICD-10-CM

## 2017-12-03 DIAGNOSIS — I340 Nonrheumatic mitral (valve) insufficiency: Secondary | ICD-10-CM

## 2017-12-03 LAB — GLUCOSE, CAPILLARY
GLUCOSE-CAPILLARY: 185 mg/dL — AB (ref 65–99)
GLUCOSE-CAPILLARY: 157 mg/dL — AB (ref 65–99)
GLUCOSE-CAPILLARY: 117 mg/dL — AB (ref 65–99)
GLUCOSE-CAPILLARY: 135 mg/dL — AB (ref 65–99)
Glucose-Capillary: 143 mg/dL — ABNORMAL HIGH (ref 65–99)
Glucose-Capillary: 239 mg/dL — ABNORMAL HIGH (ref 65–99)
Glucose-Capillary: 136 mg/dL — ABNORMAL HIGH (ref 65–99)

## 2017-12-03 LAB — BASIC METABOLIC PANEL
ANION GAP: 9 (ref 5–15)
Anion gap: 9 (ref 5–15)
Anion gap: 12 (ref 5–15)
Anion gap: 12 (ref 5–15)
BUN: 28 mg/dL — ABNORMAL HIGH (ref 6–20)
BUN: 26 mg/dL — AB (ref 6–20)
BUN: 30 mg/dL — AB (ref 6–20)
BUN: 33 mg/dL — AB (ref 6–20)
CALCIUM: 8 mg/dL — AB (ref 8.9–10.3)
CALCIUM: 8.5 mg/dL — AB (ref 8.9–10.3)
CHLORIDE: 105 mmol/L (ref 101–111)
CHLORIDE: 105 mmol/L (ref 101–111)
CHLORIDE: 106 mmol/L (ref 101–111)
CO2: 25 mmol/L (ref 22–32)
CO2: 20 mmol/L — AB (ref 22–32)
CO2: 25 mmol/L (ref 22–32)
CO2: 25 mmol/L (ref 22–32)
CREATININE: 1.69 mg/dL — AB (ref 0.61–1.24)
CREATININE: 1.8 mg/dL — AB (ref 0.61–1.24)
CREATININE: 1.85 mg/dL — AB (ref 0.61–1.24)
Calcium: 8 mg/dL — ABNORMAL LOW (ref 8.9–10.3)
Calcium: 8.2 mg/dL — ABNORMAL LOW (ref 8.9–10.3)
Chloride: 107 mmol/L (ref 101–111)
Creatinine, Ser: 1.7 mg/dL — ABNORMAL HIGH (ref 0.61–1.24)
GFR calc Af Amer: 47 mL/min — ABNORMAL LOW (ref >60)
GFR calc Af Amer: 47 mL/min — ABNORMAL LOW (ref >60)
GFR calc Af Amer: 44 mL/min — ABNORMAL LOW (ref >60)
GFR calc non Af Amer: 41 mL/min — ABNORMAL LOW (ref >60)
GFR calc non Af Amer: 40 mL/min — ABNORMAL LOW (ref >60)
GFR calc non Af Amer: 38 mL/min — ABNORMAL LOW (ref >60)
GFR calc non Af Amer: 36 mL/min — ABNORMAL LOW (ref >60)
GFR, EST AFRICAN AMERICAN: 42 mL/min — AB (ref >60)
GLUCOSE: 160 mg/dL — AB (ref 65–99)
Glucose, Bld: 206 mg/dL — ABNORMAL HIGH (ref 65–99)
Glucose, Bld: 236 mg/dL — ABNORMAL HIGH (ref 65–99)
Glucose, Bld: 166 mg/dL — ABNORMAL HIGH (ref 65–99)
POTASSIUM: 4.2 mmol/L (ref 3.5–5.1)
Potassium: 6.4 mmol/L (ref 3.5–5.1)
Potassium: 4.3 mmol/L (ref 3.5–5.1)
Potassium: 4.1 mmol/L (ref 3.5–5.1)
SODIUM: 139 mmol/L (ref 135–145)
SODIUM: 142 mmol/L (ref 135–145)
Sodium: 140 mmol/L (ref 135–145)
Sodium: 139 mmol/L (ref 135–145)

## 2017-12-03 LAB — TRIGLYCERIDES: TRIGLYCERIDES: 87 mg/dL (ref <150)

## 2017-12-03 LAB — CBC
HCT: 40.7 % (ref 40.0–52.0)
HEMOGLOBIN: 13.7 g/dL (ref 13.0–18.0)
MCH: 33.2 pg (ref 26.0–34.0)
MCHC: 33.6 g/dL (ref 32.0–36.0)
MCV: 98.7 fL (ref 80.0–100.0)
Platelets: 156 10*3/uL (ref 150–440)
RBC: 4.12 MIL/uL — AB (ref 4.40–5.90)
RDW: 14.3 % (ref 11.5–14.5)
WBC: 24.1 10*3/uL — ABNORMAL HIGH (ref 3.8–10.6)

## 2017-12-03 LAB — PROTIME-INR
INR: 1.11
INR: 1.13
PROTHROMBIN TIME: 14.4 s (ref 11.4–15.2)
Prothrombin Time: 14.2 seconds (ref 11.4–15.2)

## 2017-12-03 LAB — BLOOD GAS, ARTERIAL
Acid-base deficit: 3.3 mmol/L — ABNORMAL HIGH (ref 0.0–2.0)
Bicarbonate: 24.3 mmol/L (ref 20.0–28.0)
FIO2: 0.5
O2 Saturation: 99 %
PCO2 ART: 53 mmHg — AB (ref 32.0–48.0)
PEEP: 5 cmH2O
Patient temperature: 37
RATE: 18 resp/min
VT: 500 mL
pH, Arterial: 7.27 — ABNORMAL LOW (ref 7.350–7.450)
pO2, Arterial: 146 mmHg — ABNORMAL HIGH (ref 83.0–108.0)

## 2017-12-03 LAB — APTT
aPTT: 30 seconds (ref 24–36)
aPTT: 34 seconds (ref 24–36)

## 2017-12-03 LAB — LACTIC ACID, PLASMA: Lactic Acid, Venous: 1.4 mmol/L (ref 0.5–1.9)

## 2017-12-03 LAB — MRSA PCR SCREENING: MRSA by PCR: NEGATIVE

## 2017-12-03 LAB — ECHOCARDIOGRAM COMPLETE
HEIGHTINCHES: 71 in
Weight: 3840 oz

## 2017-12-03 LAB — PROCALCITONIN
PROCALCITONIN: 2.09 ng/mL
PROCALCITONIN: 3.01 ng/mL

## 2017-12-03 LAB — MAGNESIUM
Magnesium: 2.2 mg/dL (ref 1.7–2.4)
Magnesium: 2.1 mg/dL (ref 1.7–2.4)

## 2017-12-03 LAB — TROPONIN I
TROPONIN I: 0.37 ng/mL — AB (ref <0.03)
TROPONIN I: 0.46 ng/mL — AB (ref <0.03)

## 2017-12-03 LAB — PHOSPHORUS: Phosphorus: 3 mg/dL (ref 2.5–4.6)

## 2017-12-03 MED ORDER — ACETAMINOPHEN 650 MG RE SUPP: 650.0000 mg | Freq: Four times a day (QID) | RECTAL | Status: DC | PRN

## 2017-12-03 MED ORDER — BUDESONIDE 0.25 MG/2ML IN SUSP
0.2500 mg | Freq: Four times a day (QID) | RESPIRATORY_TRACT | Status: DC
  Administered 2017-12-03 – 2017-12-06 (×13): 0.25 mg via RESPIRATORY_TRACT
  Filled 2017-12-03 (×12): qty 2

## 2017-12-03 MED ORDER — DEXTROSE 50 % IV SOLN
1.0000 | Freq: Once | INTRAVENOUS | Status: AC
  Administered 2017-12-03: 50 mL via INTRAVENOUS
  Filled 2017-12-03: qty 50

## 2017-12-03 MED ORDER — IPRATROPIUM-ALBUTEROL 0.5-2.5 (3) MG/3ML IN SOLN
3.0000 mL | Freq: Four times a day (QID) | RESPIRATORY_TRACT | Status: DC
  Administered 2017-12-03 – 2017-12-06 (×12): 3 mL via RESPIRATORY_TRACT
  Filled 2017-12-03 (×12): qty 3

## 2017-12-03 MED ORDER — SODIUM BICARBONATE 8.4 % IV SOLN
50.0000 meq | Freq: Once | INTRAVENOUS | Status: AC
  Administered 2017-12-03: 50 meq via INTRAVENOUS
  Filled 2017-12-03: qty 50

## 2017-12-03 MED ORDER — INSULIN ASPART 100 UNIT/ML IV SOLN
10.0000 [IU] | Freq: Once | INTRAVENOUS | Status: AC
  Administered 2017-12-03: 10 [IU] via INTRAVENOUS
  Filled 2017-12-03: qty 0.1

## 2017-12-03 MED ORDER — LACTATED RINGERS IV SOLN
INTRAVENOUS | Status: DC
  Administered 2017-12-03: 09:00:00 via INTRAVENOUS
  Administered 2017-12-04: 50 mL/h via INTRAVENOUS
  Administered 2017-12-04: via INTRAVENOUS

## 2017-12-03 MED ORDER — FENTANYL CITRATE (PF) 100 MCG/2ML IJ SOLN
25.0000 ug | INTRAMUSCULAR | Status: DC | PRN
  Administered 2017-12-04: 50 ug via INTRAVENOUS
  Administered 2017-12-04: 100 ug via INTRAVENOUS
  Filled 2017-12-03 (×2): qty 2

## 2017-12-03 MED ORDER — AMIODARONE IV BOLUS ONLY 150 MG/100ML
150.0000 mg | Freq: Once | INTRAVENOUS | Status: AC
  Administered 2017-12-03: 150 mg via INTRAVENOUS

## 2017-12-03 MED ORDER — VALPROATE SODIUM 500 MG/5ML IV SOLN
500.0000 mg | Freq: Three times a day (TID) | INTRAVENOUS | Status: DC
  Administered 2017-12-03 – 2017-12-05 (×6): 500 mg via INTRAVENOUS
  Filled 2017-12-03 (×9): qty 5

## 2017-12-03 MED ORDER — DEXTROSE 5 % IV SOLN
2.0000 g | Freq: Two times a day (BID) | INTRAVENOUS | Status: DC
  Administered 2017-12-03 – 2017-12-06 (×7): 2 g via INTRAVENOUS
  Filled 2017-12-03 (×8): qty 2

## 2017-12-03 MED ORDER — MIDAZOLAM HCL 2 MG/2ML IJ SOLN
2.0000 mg | INTRAMUSCULAR | Status: DC | PRN
  Administered 2017-12-03 (×2): 4 mg via INTRAVENOUS
  Administered 2017-12-03: 2 mg via INTRAVENOUS
  Administered 2017-12-03 – 2017-12-05 (×4): 4 mg via INTRAVENOUS
  Administered 2017-12-06: 2 mg via INTRAVENOUS
  Filled 2017-12-03: qty 4
  Filled 2017-12-03: qty 2
  Filled 2017-12-03 (×2): qty 4
  Filled 2017-12-03: qty 2
  Filled 2017-12-03 (×2): qty 4

## 2017-12-03 MED ORDER — SODIUM CHLORIDE 0.9 % IV SOLN
1.0000 g | Freq: Once | INTRAVENOUS | Status: AC
  Administered 2017-12-03: 1 g via INTRAVENOUS
  Filled 2017-12-03: qty 10

## 2017-12-03 MED ORDER — MIDAZOLAM HCL 2 MG/2ML IJ SOLN
INTRAMUSCULAR | Status: AC
  Administered 2017-12-03: 4 mg via INTRAVENOUS
  Filled 2017-12-03: qty 4

## 2017-12-03 MED ORDER — ACETAMINOPHEN 325 MG PO TABS: 650.0000 mg | ORAL_TABLET | Freq: Four times a day (QID) | ORAL | Status: DC | PRN

## 2017-12-03 MED ORDER — MIDAZOLAM HCL 2 MG/2ML IJ SOLN
2.0000 mg | INTRAMUSCULAR | Status: DC | PRN
  Administered 2017-12-03: 4 mg via INTRAVENOUS
  Filled 2017-12-03: qty 4

## 2017-12-03 MED FILL — Medication: Qty: 1 | Status: AC

## 2017-12-03 NOTE — Progress Notes (Signed)
Pharmacy Antibiotic Note  Chad Hess is a 67 y.o. male admitted on 12/08/2017 with sepsis.  Pharmacy has been consulted for cefepime dosing.  Plan: Will start patient on cefepime 2g IV q12h  Height: 5\' 11"  (180.3 cm) Weight: 240 lb (108.9 kg) IBW/kg (Calculated) : 75.3  Temp (24hrs), Avg:96.5 F (35.8 C), Min:94.3 F (34.6 C), Max:97.5 F (36.4 C)  Recent Labs  Lab 12/17/2017 1750 12/03/17 0057 12/03/17 0522  WBC 19.8*  --  24.1*  CREATININE 1.45* 1.69*  --   LATICACIDVEN 6.5* 1.4  --     Estimated Creatinine Clearance: 53.9 mL/min (A) (by C-G formula based on SCr of 1.69 mg/dL (H)).    Allergies  Allergen Reactions  . Chantix [Varenicline]     Thank you for allowing pharmacy to be a part of this patient's care.  Tobie Lords, PharmD, BCPS Clinical Pharmacist 12/03/2017

## 2017-12-03 NOTE — Progress Notes (Signed)
Spoke with Dr Mortimer Fries regarding patients heart rate dropping into the 50's. Per Dr Mortimer Fries decrease amiodarone down to 15 mg/hr.

## 2017-12-03 NOTE — Progress Notes (Signed)
[facility] [place] NAME: Chad Hess    MR#:  [id number]  DATE OF BIRTH:  1951/08/22  SUBJECTIVE:  CHIEF COMPLAINT:   Chief Complaint  Patient presents with  . Cardiac Arrest   Patient unresponsive on ventilator.  Family at bedside.  Hypothermia protocol in place.  REVIEW OF SYSTEMS:    Review of Systems  Unable to perform ROS: Intubated    DRUG ALLERGIES:   Allergies  Allergen Reactions  . Chantix [Varenicline]     VITALS:  Blood pressure 95/65, pulse 65, temperature (!) 95.9 F (35.5 C), resp. rate 19, height 5\' 11"  (1.803 m), weight 108.9 kg (240 lb), SpO2 100 %.  PHYSICAL EXAMINATION:   Physical Exam  GENERAL:  67 y.o.-year-old patient lying in the bed  HEENT: Head atraumatic, normocephalic. ETT NECK: no jugular venous distention. No thyroid enlargement, no tenderness.  LUNGS: There to auscultation bilaterally CARDIOVASCULAR: S1, S2  ABDOMEN: Soft nondistended. EXTREMITIES: No cyanosis, clubbing PSYCHIATRIC: The patient is sedated  LABORATORY PANEL:   CBC Recent Labs  Lab 12/03/17 0522  WBC 24.1*  HGB 13.7  HCT 40.7  PLT 156   ------------------------------------------------------------------------------------------------------------------ Chemistries  Recent Labs  Lab 11/19/2017 1750  12/03/17 0522 12/03/17 0828  NA 143   < > 140 142  K 5.4*   < > 4.2 4.3  CL 103   < > 106 105  CO2 26   < > 25 25  GLUCOSE 192*   < > 236* 166*  BUN 16   < > 28* 30*  CREATININE 1.45*   < > 1.70* 1.80*  CALCIUM 8.4*   < > 8.0* 8.5*  MG  --    < > 2.1  --   AST 76*  --   --   --   ALT 47  --   --   --   ALKPHOS 90  --   --   --   BILITOT 0.9  --   --   --    < > = values in this interval not displayed.   ------------------------------------------------------------------------------------------------------------------  Cardiac Enzymes Recent Labs  Lab 12/03/17 0522  TROPONINI 0.37*    ------------------------------------------------------------------------------------------------------------------  RADIOLOGY:  Dg Abd 1 View  Result Date: 12/03/2017 CLINICAL DATA:  Orogastric tube placement. EXAM: ABDOMEN - 1 VIEW COMPARISON:  Radiographs of December 02, 2017. FINDINGS: Mild gastric distention is noted. Distal tip of orogastric tube is seen in expected position of distal stomach. No other bowel dilatation is noted. IMPRESSION: Distal tip of orogastric tube seen in distal portion of stomach, which is mildly distended. Electronically Signed   By: Marijo Conception, M.D.   On: 12/03/2017 08:21   Dg Abd 1 View  Result Date: 12/03/2017 CLINICAL DATA:  Orogastric tube placement. EXAM: ABDOMEN - 1 VIEW COMPARISON:  None. FINDINGS: No orogastric tube within field of view. Gas distended stomach, 4.9 cm gas distended probable transverse colon. No intra-abdominal mass effect or pathologic calcifications. RIGHT femoral central venous catheter distal tip projects at the level of L5. soft tissue planes and included osseous structures are non suspicious. Moderate vascular calcifications. IMPRESSION: Gas distended stomach.  No orogastric tube within field of view. RIGHT femoral central venous catheter distal tip projecting at L5. Electronically Signed   By: Elon Alas M.D.   On: 12/03/2017 00:44   Ct Head Wo Contrast  Result Date: 12/16/2017 CLINICAL DATA:  Seizure, CPR EXAM: CT HEAD WITHOUT CONTRAST TECHNIQUE: Contiguous axial images  were obtained from the base of the skull through the vertex without intravenous contrast. COMPARISON:  None. FINDINGS: Brain: No acute territorial infarction, hemorrhage or intracranial mass is visualized. Mild asymmetric hypodensity in the left pons and cerebellar peduncle, suspected to be secondary to streak artifact given appearance on coronal and sagittal views. The ventricles are nonenlarged. Mild dilatation of anterior extra-axial CSF spaces. Vascular: No  hyperdense vessels.  Carotid artery calcification Skull: No fracture.  Small fluid in the right mastoid Sinuses/Orbits: Fluid levels in the sphenoid sinuses. Mucosal thickening in the ethmoid sinuses. No acute orbital abnormality Other: None IMPRESSION: No definite CT evidence for acute intracranial abnormality. Electronically Signed   By: Donavan Foil M.D.   On: 11/23/2017 18:31   Dg Chest Port 1 View  Result Date: 12/03/2017 CLINICAL DATA:  Central line attempt. EXAM: PORTABLE CHEST 1 VIEW COMPARISON:  Chest radiograph December 02, 2017 FINDINGS: Cardiac silhouette is mildly enlarged. Mildly calcified aortic knob. No pleural effusion or focal consolidation. Mild increased lung volumes. Mild bronchitic changes. No pneumothorax. Endotracheal tube tip projects 5.1 cm above the carina. Soft tissue planes and included osseous structures are unchanged. IMPRESSION: No central line within field of view. No pneumothorax. Endotracheal tube tip projects 5.1 cm above the carina. Mild cardiomegaly and probable COPD. Electronically Signed   By: Elon Alas M.D.   On: 12/03/2017 00:38   Dg Chest Portable 1 View  Result Date: 12/05/2017 CLINICAL DATA:  Post intubation and CPR. EXAM: PORTABLE CHEST 1 VIEW COMPARISON:  10/23/2017; 07/28/2017; 07/26/2017 FINDINGS: Grossly unchanged borderline enlarged cardiac silhouette and mediastinal contours with atherosclerotic plaque within the thoracic aorta. There is persistent thickening the right paratracheal stripe, presumably secondary to prominent vasculature. Endotracheal tube overlies the tracheal air column with tip approximately 3.5 cm above the carina. Epicardial pacer leads overlie the cardiac apex. No supine evidence of pneumothorax or pleural effusion. Overall improved aeration of the lungs. No focal airspace opacities. No evidence of edema. No acute osseus abnormalities. IMPRESSION: 1. Appropriately positioned support apparatus. No supine evidence of pneumothorax.  2. No focal airspace opacities or evidence of edema. Electronically Signed   By: Sandi Mariscal M.D.   On: 12/15/2017 18:16     ASSESSMENT AND PLAN:   *Cardiac arrest with PEA likely due to acute hypoxic respiratory failure from COPD exacerbation With significant leukocytosis sepsis could be a cause.  Pro-calcitonin elevated.  Cultures pending.  On full ventilatory support.  Hypothermia protocol in place.  Discussed with ICU team.  Dr. Mortimer Fries.  *Acute hypoxic respiratory failure due to acute COPD exacerbation On steroids, nebulizers.  Continue full ventilatory support  *Chronic atrial fibrillation.  With rapid ventricular rate.  On amiodarone drip.  *Mild elevation in troponin due to cardiac arrest.  *Acute kidney injury with hyperkalemia is improving.  *Lactic acidosis due to cardiac arrest./Sepsis.  All the records are reviewed and case discussed with Care Management/Social Worker Management plans discussed with the patient, family and they are in agreement.  CODE STATUS: FULL CODE  DVT Prophylaxis: SCDs  TOTAL TIME TAKING CARE OF THIS PATIENT: 25 minutes.   Neita Carp M.D on 12/03/2017 at 2:49 PM  Between 7am to 6pm - Pager - [phone]  After 6pm go to [email or web address] - password [email or web address] [facility]  Office  [phone]  CC: Primary care physician; Alanson Aly, FNP  Note: This dictation was prepared with Dragon dictation along with smaller phrase technology. Any transcriptional errors that result from this process are unintentional.

## 2017-12-03 NOTE — Plan of Care (Signed)
  Progressing Clinical Measurements: Ability to maintain clinical measurements within normal limits will improve 12/03/2017 0656 - Progressing by Morene Crocker, RN Will remain free from infection 12/03/2017 0656 - Progressing by Morene Crocker, RN Diagnostic test results will improve 12/03/2017 0656 - Progressing by Morene Crocker, RN Respiratory complications will improve 12/03/2017 0656 - Progressing by Morene Crocker, RN Cardiovascular complication will be avoided 12/03/2017 0656 - Progressing by Morene Crocker, RN Elimination: Will not experience complications related to bowel motility 12/03/2017 0656 - Progressing by Morene Crocker, RN Will not experience complications related to urinary retention 12/03/2017 0656 - Progressing by Morene Crocker, RN Pain Managment: General experience of comfort will improve 12/03/2017 0656 - Progressing by Morene Crocker, RN Safety: Ability to remain free from injury will improve 12/03/2017 0656 - Progressing by Morene Crocker, RN Skin Integrity: Risk for impaired skin integrity will decrease 12/03/2017 0656 - Progressing by Morene Crocker, RN

## 2017-12-03 NOTE — Progress Notes (Signed)
Per Dr Leonidas Romberg turning propofol back on for patient comfort (per Dr Leonidas Romberg having myoclonic jerks). At this point MD stated not to give him versed.

## 2017-12-03 NOTE — Progress Notes (Signed)
Initial Nutrition Assessment  DOCUMENTATION CODES:   Not applicable  INTERVENTION:  Once patient is rewarmed recommend initiating Vital High Protein at 50 mL/hr (1200 mL goal daily volume) via OGT. Provides 1200 kcal, 105 grams of protein, 1008 mL H2O daily. With current propofol rate provides 1855 kcal.  Provide liquid MVI daily per tube as goal tube feed regimen does not meet 100% RDIs for vitamins/minerals.  NUTRITION DIAGNOSIS:   Inadequate oral intake related to inability to eat as evidenced by NPO status.  GOAL:   Provide needs based on ASPEN/SCCM guidelines  MONITOR:   Vent status, Labs, Weight trends, TF tolerance, I & O's  REASON FOR ASSESSMENT:   Ventilator    ASSESSMENT:   67 year old male with PMHx of A-fib, CAD, COPD, diverticulitis, ED, GERD, HLD, HTN, PUD, pulmonary hypertension, sleep apnea noncompliant with CPAP, CHF who presented following PEA arrest with estimated downtime of 30 minutes before ROSC and was intubated in the field on 1/14, initiated on 36C temperature management protocol. Patient having myoclonus.   Several family members present at time of RD assessment. Patient on 36 C temperature management protocol. Cooling target temperature was achieved at 0100 this AM. RD suspects weight of 240 lbs (108.9 kg) from 1/14 is inaccurate. Family reports his UBW is 165-175 lbs. He was 77.8 kg on 10/25/2017. Noted on dry erase board in room patient was weighed at 82.6 kg, which is likely more accurate. Will use 82.6 kg to estimate needs.  Access: OGT; terminates in distal stomach per abdominal x-ray 1/15; cm marking not documented at this time  MAP: 60-95 mmHg  Patient is currently intubated on ventilator support MV: 11.8 L/min Temp (24hrs), Avg:96.5 F (35.8 C), Min:94.3 F (34.6 C), Max:97.5 F (36.4 C)  Propofol: 24.8 ml/hr (655 kcal daily)  Medications reviewed and include: Novolog 0-9 units Q4hrs, amiodarone gtt, cefepime, famotidine, LR @ 50 mL/hr,  Keppra, norepinephrine gtt (4 mcg/min), propofol gtt, valproate.  Labs reviewed: CBG 117-157, BUN 30, Creatinine 1.8.  Patient does not meet criteria for malnutrition.  Discussed with RN and in rounds.  NUTRITION - FOCUSED PHYSICAL EXAM:    Most Recent Value  Orbital Region  No depletion  Upper Arm Region  No depletion  Thoracic and Lumbar Region  No depletion  Buccal Region  No depletion  Temple Region  No depletion  Clavicle Bone Region  No depletion  Clavicle and Acromion Bone Region  Mild depletion  Scapular Bone Region  Unable to assess  Dorsal Hand  No depletion  Patellar Region  No depletion  Anterior Thigh Region  No depletion  Posterior Calf Region  No depletion  Edema (RD Assessment)  Mild  Hair  Reviewed  Eyes  Reviewed  Mouth  Reviewed  Skin  Reviewed  Nails  Reviewed     Diet Order:  No diet orders on file  EDUCATION NEEDS:   No education needs have been identified at this time  Skin:  Skin Assessment: Reviewed RN Assessment  Last BM:  12/03/2016 (BM characteristics not documented)  Height:   Ht Readings from Last 1 Encounters:  12/05/2017 5\' 11"  (1.803 m)    Weight:   Wt Readings from Last 1 Encounters:  11/26/2017 240 lb (108.9 kg)    Ideal Body Weight:  78.2 kg  BMI:  Body mass index is 33.47 kg/m.  With weight of 82.7 kg patient's BMI is 25.4 kg/m2  Estimated Nutritional Needs:   Kcal:  1900 (PSU 2003b w/ MSJ 1632,  Ve 11.8, Tmax 37 as patient is on normothermia protocol)  Protein:  100-124 grams (1.2-1.5 grams/kg)  Fluid:  2 L/day (25 mL/kg)  Willey Blade, MS, RD, LDN Office: [phone] Pager: [phone] After Hours/Weekend Pager: [phone]

## 2017-12-03 NOTE — Procedures (Signed)
Central Venous Catheter Insertion Procedure Note LEAMON PALAU [id number] 1951-09-16  Procedure: Insertion of Central Venous Catheter Indications: Assessment of intravascular volume, Drug and/or fluid administration and Frequent blood sampling  Procedure Details Consent: Risks of procedure as well as the alternatives and risks of each were explained to the (patient/caregiver).  Consent for procedure obtained. Time Out: Verified patient identification, verified procedure, site/side was marked, verified correct patient position, special equipment/implants available, medications/allergies/relevent history reviewed, required imaging and test results available.  Performed  Maximum sterile technique was used including antiseptics, cap, gloves, gown, hand hygiene, mask and sheet. Skin prep: Chlorhexidine; local anesthetic administered A antimicrobial bonded/coated triple lumen catheter was placed in the right femoral vein due to multiple attempts, no other available access using the Seldinger technique.  Evaluation Blood flow good Complications: No apparent complications Patient did tolerate procedure well. Chest X-ray ordered to verify placement.  CXR: not indicated .  Right femoral central line placed utilizing ultrasound no complications noted during or following procedures.

## 2017-12-03 NOTE — Progress Notes (Signed)
PULMONARY / CRITICAL CARE MEDICINE   Name: Chad Hess MRN: [id number] DOB: [birth year]    ADMISSION DATE:  11/20/2017  PT PROFILE:   52 M smoker with O2 dependent COPD, CAF suffered OOH cardiac arrest with 27 mins ACLS by EMS after experiencing worsening dyspnea over 1-2 days prior.  Initial rhythm was PEA.  Intubated in field. TTM (36 C) protocol initiated in ED. Levetiracetam initiated in ED for myoclonic activity  MAJOR EVENTS/TEST RESULTS: 01/14 as above. Target T reached @ 0100 on 01/15 01/14 CT head: No definite CT evidence for acute intracranial abnormality 01/15 Cardiology consultation: PEA arrest felt to be due to respiratory failure and not a primary cardiac event. 01/15 Unresponsive on propofol infusion with frequent myoclonus. Levetiracetam changed to VPA 01/15 EEG: 01/15 Echocardiogram: LVEF 55-65%.  Mild to moderate MR.  LA severely dilated.  Moderate to severe pulmonary hypertension (RVSP estimated 45-55 mmHg)  INDWELLING DEVICES:: ETT 01/14 >>  R femoral CVL 01/15 >>   MICRO DATA: MRSA PCR 01/14 >> NEG Urine 01/14 >>  Resp 01/14 >>  Blood 01/14 >>   PCT 01/15: 2.09, 3.01  ANTIMICROBIALS:   Cefepime 01/15 >>     SUBJECTIVE:  Intubated, sedated on propofol infusion.  Frequent myoclonus.  Unresponsive.  VITAL SIGNS: BP 95/65   Pulse 65   Temp (!) 95.9 F (35.5 C)   Resp 19   Ht 5\' 11"  (1.803 m)   Wt 108.9 kg (240 lb)   SpO2 100%   BMI 33.47 kg/m   HEMODYNAMICS: CVP:  [10 mmHg-18 mmHg] 10 mmHg  VENTILATOR SETTINGS: Vent Mode: PCV FiO2 (%):  [35 %-100 %] 35 % Set Rate:  [18 bmp] 18 bmp Vt Set:  [500 mL] 500 mL PEEP:  [5 cmH20] 5 cmH20 Plateau Pressure:  [25 cmH20] 25 cmH20  INTAKE / OUTPUT: I/O last 3 completed shifts: In: 916.1 [I.V.:816.1; IV Piggyback:100] Out: 125 [Urine:125]  PHYSICAL EXAMINATION: General: Intubated, sedated Neuro: Unresponsive, myoclonus, no spontaneous movement otherwise, nonpurposeful HEENT: Anisocoria, doll's  eyes incomplete Cardiovascular: IR IR, no M noted Lungs: Distant BS, no wheezes noted Abdomen: Soft, diminished BS, no palpable masses Extremities: Cool, no edema Skin: No lesions noted  LABS:  BMET Recent Labs  Lab 12/03/17 0057 12/03/17 0522 12/03/17 0828  NA 139 140 142  K 6.4* 4.2 4.3  CL 105 106 105  CO2 25 25 25   BUN 26* 28* 30*  CREATININE 1.69* 1.70* 1.80*  GLUCOSE 206* 236* 166*    Electrolytes Recent Labs  Lab 12/03/17 0057 12/03/17 0522 12/03/17 0828  CALCIUM 8.0* 8.0* 8.5*  MG 2.2 2.1  --   PHOS  --  3.0  --     CBC Recent Labs  Lab 12/04/2017 1750 12/03/17 0522  WBC 19.8* 24.1*  HGB 14.6 13.7  HCT 44.6 40.7  PLT 179 156    Coag's Recent Labs  Lab 12/03/17 0057  APTT 30  INR 1.11    Sepsis Markers Recent Labs  Lab 11/20/2017 1750 12/03/17 0057 12/03/17 0522  LATICACIDVEN 6.5* 1.4  --   PROCALCITON  --  2.09 3.01    ABG Recent Labs  Lab 12/15/2017 2004 12/03/17 0500  PHART 7.22* 7.27*  PCO2ART 60* 53*  PO2ART 119* 146*    Liver Enzymes Recent Labs  Lab 12/19/2017 1750  AST 76*  ALT 47  ALKPHOS 90  BILITOT 0.9  ALBUMIN 3.6    Cardiac Enzymes Recent Labs  Lab 12/05/2017 1750 12/03/17 0057 12/03/17 0522  TROPONINI  0.05* 0.46* 0.37*    Glucose Recent Labs  Lab 11/20/2017 1807 12/08/2017 2214 12/03/17 0135 12/03/17 0332 12/03/17 0726 12/03/17 1121  GLUCAP 135* 143* 185* 239* 157* 117*    CXR: Cardiomegaly, no acute pulmonary findings    ASSESSMENT / PLAN:  PULMONARY A: Severe baseline COPD Smoker Acute COPD exacerbation Acute on chronic respiratory failure with respiratory arrest P:   Cont full vent support - settings reviewed and/or adjusted Cont vent bundle Daily SBT if/when meets criteria   CARDIOVASCULAR A:  Chronic atrial fibrillation Cardiac arrest P:  Monitor BP and rhythm Continue amiodarone per cardiology recommendations  RENAL A:   CKD AKI with oliguria P:   Monitor BMET  intermittently Monitor I/Os Correct electrolytes as indicated   GASTROINTESTINAL A:   No acute issues P:   SUP: IV famotidine Initiate TF protocol after rewarming if not extubated  HEMATOLOGIC A:   No acute issues P:  DVT px: SQ heparin Monitor CBC intermittently Transfuse per usual guidelines   INFECTIOUS A:   Elevated PCT without other evidence of acute infection P:   Monitor temp, WBC count Micro and abx as above   ENDOCRINE A:   Hyperglycemia, stress-induced P:   Continue SSI  NEUROLOGIC A:   Anoxic encephalopathy Myoclonus P:   RASS goal: -2, -3 Cont TTM protocol Propofol per TTM protocol Change Keppra to valproic acid   FAMILY  - Updates: Daughters and wife updated in detail.  Guarded prognosis conveyed to them.   CCM time: 45 mins  The above time includes time spent in consultation with patient and/or family members and reviewing care plan on multidisciplinary rounds  Merton Border, MD PCCM service Mobile [phone] Pager [phone] 12/03/2017, 2:38 PM

## 2017-12-03 NOTE — Progress Notes (Signed)
°   12/03/17 1755  Clinical Encounter Type  Visited With Patient not available;Health care provider  Visit Type Follow-up  Referral From Nurse  Consult/Referral To Chaplain  Spiritual Encounters  Spiritual Needs Prayer  CH was asked by the RN to pray for Patient. [place] prayed at bedside.

## 2017-12-03 NOTE — Progress Notes (Signed)
Dr Leonidas Romberg aware of patients increased labored breathing. Sedation has been turned off and Dr Leonidas Romberg switched patient to pressure control. I asked MD to come and evaluate patient. He states he will be in for the evaluation.

## 2017-12-03 NOTE — Consult Note (Signed)
[facility] Cardiology  CARDIOLOGY CONSULT NOTE  Patient ID: DOIL KAMARA MRN: [id number] DOB/AGE: 07/16/1951 67 y.o.  Admit date: 11/26/2017 Referring Physician [place] Primary Physician [facility] Primary Cardiologist Fath Reason for Consultation status post cardiopulmonary arrest  HPI: 67 year old gentleman referred for cardiac evaluation following cardiopulmonary arrest.  The patient has a history of COPD, obstructive sleep apnea, and pulmonary hypertension who presents to [facility] emergency room after several day history of upper respiratory symptoms, and worsening shortness of breath.  EMS was called, patient noted to have agonal respirations, with PEA, receiving CPR, and brought to [facility] ED where he was noted to have tonic-clonic jerking movements.  Patient intubated and placed on ventilator.  ECG revealed atrial fibrillation with controlled ventricular rate.  CT scan was negative for intracranial abnormality.  Patient was placed on hypothermic protocol.  Admission labs were notable for borderline elevated troponin of 0.05 and 0.46.  Review of systems complete and found to be negative unless listed above     Past Medical History:  Diagnosis Date  . Atrial fibrillation ([place])   . CHF (congestive heart failure) ([place])   . Colon polyp   . COPD (chronic obstructive pulmonary disease) ([place])   . Coronary artery disease   . Diverticulitis   . ED (erectile dysfunction)   . GERD (gastroesophageal reflux disease)   . Hyperlipidemia   . Hypertension   . PUD (peptic ulcer disease)   . Pulmonary hypertension ([place])   . Sleep apnea    noncompliant with CPAP    Past Surgical History:  Procedure Laterality Date  . COLONOSCOPY  12/07/2004  . COLONOSCOPY N/A 04/03/2016   Procedure: COLONOSCOPY;  Surgeon: Lollie Sails, MD;  Location: [facility] ENDOSCOPY;  Service: Endoscopy;  Laterality: N/A;  . COLONOSCOPY WITH PROPOFOL N/A 02/20/2016   Procedure: COLONOSCOPY WITH PROPOFOL;  Surgeon: Lollie Sails, MD;   Location: [facility] ENDOSCOPY;  Service: Endoscopy;  Laterality: N/A;  . COLONOSCOPY WITH PROPOFOL N/A 04/02/2016   Procedure: COLONOSCOPY WITH PROPOFOL;  Surgeon: Lollie Sails, MD;  Location: [facility] ENDOSCOPY;  Service: Endoscopy;  Laterality: N/A;  . HERNIA REPAIR Right     Medications Prior to Admission  Medication Sig Dispense Refill Last Dose  . aspirin 81 MG tablet Take 81 mg by mouth daily.   11/24/2017 at Unknown time  . atorvastatin (LIPITOR) 40 MG tablet Take 40 mg by mouth daily.   12/15/2017 at Unknown time  . furosemide (LASIX) 20 MG tablet Take 1 tablet (20 mg total) by mouth daily. 30 tablet 5 11/24/2017 at Unknown time  . metoprolol (TOPROL-XL) 200 MG 24 hr tablet Take 200 mg by mouth daily.   12/05/2017 at Unknown time  . acetaminophen (TYLENOL) 325 MG tablet Take 1 tablet (325 mg total) by mouth every 6 (six) hours as needed for mild pain or fever (or Fever >/= 101).   prn at prn  . albuterol (PROVENTIL HFA;VENTOLIN HFA) 108 (90 Base) MCG/ACT inhaler Inhale 2 puffs into the lungs every 4 (four) hours as needed for wheezing or shortness of breath. 1 Inhaler 1 prn at prn  . albuterol (PROVENTIL) (2.5 MG/3ML) 0.083% nebulizer solution Take 3 mLs (2.5 mg total) by nebulization every 4 (four) hours as needed for wheezing or shortness of breath. 60 vial 1 prn at prn  . doxycycline (VIBRA-TABS) 100 MG tablet Take 1 tablet (100 mg total) by mouth every 12 (twelve) hours. (Patient not taking: Reported on 12/09/2017) 10 tablet 0 Not Taking at Unknown time  . ipratropium-albuterol (  DUONEB) 0.5-2.5 (3) MG/3ML SOLN Take 3 mLs by nebulization every 6 (six) hours as needed. 400 mL 0 prn at prn  . nicotine (NICODERM CQ - DOSED IN MG/24 HOURS) 21 mg/24hr patch Place 1 patch (21 mg total) onto the skin daily. 28 patch 0 prn at prn  . nitroGLYCERIN (NITROSTAT) 0.4 MG SL tablet Place 0.4 mg under the tongue every 5 (five) minutes as needed for chest pain.   prn at prn  . predniSONE (STERAPRED UNI-PAK 21 TAB)  10 MG (21) TBPK tablet Take 1 tablet (10 mg total) by mouth daily. Take 6 tablets by mouth for 1 day followed by  5 tablets by mouth for 1 day followed by  4 tablets by mouth for 1 day followed by  3 tablets by mouth for 1 day followed by  2 tablets by mouth for 1 day followed by  1 tablet by mouth for a day and stop (Patient not taking: Reported on 11/28/2017) 21 tablet 0 Not Taking at Unknown time   Social History   Socioeconomic History  . Marital status: Married    Spouse name: Not on file  . Number of children: Not on file  . Years of education: Not on file  . Highest education level: Not on file  Social Needs  . Financial resource strain: Not on file  . Food insecurity - worry: Not on file  . Food insecurity - inability: Not on file  . Transportation needs - medical: Not on file  . Transportation needs - non-medical: Not on file  Occupational History  . Not on file  Tobacco Use  . Smoking status: Current Every Day Smoker    Packs/day: 1.00    Years: 50.00    Pack years: 50.00  . Smokeless tobacco: Never Used  Substance and Sexual Activity  . Alcohol use: Yes  . Drug use: No  . Sexual activity: Not on file  Other Topics Concern  . Not on file  Social History Narrative  . Not on file    Family History  Problem Relation Age of Onset  . Sudden death Mother   . Epilepsy Mother       Review of systems complete and found to be negative unless listed above      PHYSICAL EXAM  General: Well developed, well nourished, in no acute distress HEENT:  Normocephalic and atramatic Neck:  No JVD.  Lungs: Clear bilaterally to auscultation and percussion. Heart: HRRR . Normal S1 and S2 without gallops or murmurs.  Abdomen: Bowel sounds are positive, abdomen soft and non-tender  Msk:  Back normal, normal gait. Normal strength and tone for age. Extremities: No clubbing, cyanosis or edema.   Neuro: Alert and oriented X 3. Psych:  Good affect, responds  appropriately  Labs:   Lab Results  Component Value Date   WBC 24.1 (H) 12/03/2017   HGB 13.7 12/03/2017   HCT 40.7 12/03/2017   MCV 98.7 12/03/2017   PLT 156 12/03/2017    Recent Labs  Lab 11/29/2017 1750  12/03/17 0522  NA 143   < > 140  K 5.4*   < > 4.2  CL 103   < > 106  CO2 26   < > 25  BUN 16   < > 28*  CREATININE 1.45*   < > 1.70*  CALCIUM 8.4*   < > 8.0*  PROT 6.2*  --   --   BILITOT 0.9  --   --   [facility]  90  --   --   ALT 47  --   --   AST 76*  --   --   GLUCOSE 192*   < > 236*   < > = values in this interval not displayed.   Lab Results  Component Value Date   TROPONINI 0.37 ([place]) 12/03/2017   No results found for: CHOL No results found for: HDL No results found for: [facility] Lab Results  Component Value Date   TRIG 87 12/03/2017   No results found for: CHOLHDL No results found for: LDLDIRECT    Radiology: Dg Abd 1 View  Result Date: 12/03/2017 CLINICAL DATA:  Orogastric tube placement. EXAM: ABDOMEN - 1 VIEW COMPARISON:  None. FINDINGS: No orogastric tube within field of view. Gas distended stomach, 4.9 cm gas distended probable transverse colon. No intra-abdominal mass effect or pathologic calcifications. RIGHT femoral central venous catheter distal tip projects at the level of L5. soft tissue planes and included osseous structures are non suspicious. Moderate vascular calcifications. IMPRESSION: Gas distended stomach.  No orogastric tube within field of view. RIGHT femoral central venous catheter distal tip projecting at L5. Electronically Signed   By: Elon Alas M.D.   On: 12/03/2017 00:44   Ct Head Wo Contrast  Result Date: 12/08/2017 CLINICAL DATA:  Seizure, CPR EXAM: CT HEAD WITHOUT CONTRAST TECHNIQUE: Contiguous axial images were obtained from the base of the skull through the vertex without intravenous contrast. COMPARISON:  None. FINDINGS: Brain: No acute territorial infarction, hemorrhage or intracranial mass is visualized. Mild asymmetric  hypodensity in the left pons and cerebellar peduncle, suspected to be secondary to streak artifact given appearance on coronal and sagittal views. The ventricles are nonenlarged. Mild dilatation of anterior extra-axial CSF spaces. Vascular: No hyperdense vessels.  Carotid artery calcification Skull: No fracture.  Small fluid in the right mastoid Sinuses/Orbits: Fluid levels in the sphenoid sinuses. Mucosal thickening in the ethmoid sinuses. No acute orbital abnormality Other: None IMPRESSION: No definite CT evidence for acute intracranial abnormality. Electronically Signed   By: Donavan Foil M.D.   On: 12/12/2017 18:31   Dg Chest Port 1 View  Result Date: 12/03/2017 CLINICAL DATA:  Central line attempt. EXAM: PORTABLE CHEST 1 VIEW COMPARISON:  Chest radiograph December 02, 2017 FINDINGS: Cardiac silhouette is mildly enlarged. Mildly calcified aortic knob. No pleural effusion or focal consolidation. Mild increased lung volumes. Mild bronchitic changes. No pneumothorax. Endotracheal tube tip projects 5.1 cm above the carina. Soft tissue planes and included osseous structures are unchanged. IMPRESSION: No central line within field of view. No pneumothorax. Endotracheal tube tip projects 5.1 cm above the carina. Mild cardiomegaly and probable COPD. Electronically Signed   By: Elon Alas M.D.   On: 12/03/2017 00:38   Dg Chest Portable 1 View  Result Date: 12/09/2017 CLINICAL DATA:  Post intubation and CPR. EXAM: PORTABLE CHEST 1 VIEW COMPARISON:  10/23/2017; 07/28/2017; 07/26/2017 FINDINGS: Grossly unchanged borderline enlarged cardiac silhouette and mediastinal contours with atherosclerotic plaque within the thoracic aorta. There is persistent thickening the right paratracheal stripe, presumably secondary to prominent vasculature. Endotracheal tube overlies the tracheal air column with tip approximately 3.5 cm above the carina. Epicardial pacer leads overlie the cardiac apex. No supine evidence of  pneumothorax or pleural effusion. Overall improved aeration of the lungs. No focal airspace opacities. No evidence of edema. No acute osseus abnormalities. IMPRESSION: 1. Appropriately positioned support apparatus. No supine evidence of pneumothorax. 2. No focal airspace opacities or evidence of edema. Electronically Signed  By: Sandi Mariscal M.D.   On: 11/26/2017 18:16    EKG: Atrial fibrillation with controlled ventricular rate  ASSESSMENT AND PLAN:   1.  Status post PEA arrest in the setting of respiratory failure, likely noncardiac in nature, with minimal elevated troponin, unremarkable ECG, without indication of acute coronary syndrome or ventricular arrhythmias 2.  Known history of chronic atrial fibrillation, with controlled ventricular rate 3.  Acute on chronic hypoxic respiratory failure  Recommendations  1.  Agree with current therapy 2.  Defer cardiac catheterization 3.  Review 2D echocardiogram  4.  Further recommendations pending echocardiogram results  Signed: Isaias Cowman MD,PhD, [facility] 12/03/2017, 8:11 AM

## 2017-12-03 NOTE — Progress Notes (Signed)
During Dr Leonidas Romberg rounds spoke with MD regarding patient being non responsive to painful stimuli and not withdrawing. Orders to stop versed and fentanyl. Md is aware that patient does have times of jerking and shivering. MD instructed to call him when it occurs. Md made aware of patients unequal pupils and blood in the left eye. He also is having accessory muscle use.

## 2017-12-03 NOTE — Progress Notes (Signed)
After further discussion with Daughter who is HCPOA, she has asked that patient be made DNR.  She agreed and consented to DNR.  I have explained that patient has multiorgan failure and need to assess NEURO status once hypothermia protocol to be completed.    Corrin Parker, M.D.  [facility]  Medical Director [place] Director [facility] Cardio-Pulmonary Department

## 2017-12-04 ENCOUNTER — Inpatient Hospital Stay: Payer: Medicare HMO

## 2017-12-04 DIAGNOSIS — Z9911 Dependence on respirator [ventilator] status: Secondary | ICD-10-CM

## 2017-12-04 LAB — URINE CULTURE: Culture: NO GROWTH

## 2017-12-04 LAB — BASIC METABOLIC PANEL
Anion gap: 12 (ref 5–15)
BUN: 33 mg/dL — AB (ref 6–20)
CALCIUM: 8.2 mg/dL — AB (ref 8.9–10.3)
CO2: 20 mmol/L — ABNORMAL LOW (ref 22–32)
CREATININE: 1.77 mg/dL — AB (ref 0.61–1.24)
Chloride: 106 mmol/L (ref 101–111)
GFR calc Af Amer: 44 mL/min — ABNORMAL LOW (ref >60)
GFR calc non Af Amer: 38 mL/min — ABNORMAL LOW (ref >60)
GLUCOSE: 132 mg/dL — AB (ref 65–99)
POTASSIUM: 4.1 mmol/L (ref 3.5–5.1)
Sodium: 138 mmol/L (ref 135–145)

## 2017-12-04 LAB — COMPREHENSIVE METABOLIC PANEL
ALK PHOS: 58 U/L (ref 38–126)
ALT: 34 U/L (ref 17–63)
AST: 62 U/L — ABNORMAL HIGH (ref 15–41)
Albumin: 2.9 g/dL — ABNORMAL LOW (ref 3.5–5.0)
Anion gap: 13 (ref 5–15)
BILIRUBIN TOTAL: 0.7 mg/dL (ref 0.3–1.2)
BUN: 34 mg/dL — AB (ref 6–20)
CALCIUM: 8.2 mg/dL — AB (ref 8.9–10.3)
CO2: 19 mmol/L — ABNORMAL LOW (ref 22–32)
CREATININE: 1.86 mg/dL — AB (ref 0.61–1.24)
Chloride: 106 mmol/L (ref 101–111)
GFR calc Af Amer: 42 mL/min — ABNORMAL LOW (ref >60)
GFR, EST NON AFRICAN AMERICAN: 36 mL/min — AB (ref >60)
Glucose, Bld: 119 mg/dL — ABNORMAL HIGH (ref 65–99)
Potassium: 4.1 mmol/L (ref 3.5–5.1)
Sodium: 138 mmol/L (ref 135–145)
TOTAL PROTEIN: 5.4 g/dL — AB (ref 6.5–8.1)

## 2017-12-04 LAB — CBC
HEMATOCRIT: 37.5 % — AB (ref 40.0–52.0)
Hemoglobin: 12.5 g/dL — ABNORMAL LOW (ref 13.0–18.0)
MCH: 32.6 pg (ref 26.0–34.0)
MCHC: 33.4 g/dL (ref 32.0–36.0)
MCV: 97.6 fL (ref 80.0–100.0)
Platelets: 123 10*3/uL — ABNORMAL LOW (ref 150–440)
RBC: 3.84 MIL/uL — AB (ref 4.40–5.90)
RDW: 14 % (ref 11.5–14.5)
WBC: 22.2 10*3/uL — ABNORMAL HIGH (ref 3.8–10.6)

## 2017-12-04 LAB — GLUCOSE, CAPILLARY
Glucose-Capillary: 106 mg/dL — ABNORMAL HIGH (ref 65–99)
Glucose-Capillary: 106 mg/dL — ABNORMAL HIGH (ref 65–99)
Glucose-Capillary: 106 mg/dL — ABNORMAL HIGH (ref 65–99)
Glucose-Capillary: 112 mg/dL — ABNORMAL HIGH (ref 65–99)
Glucose-Capillary: 122 mg/dL — ABNORMAL HIGH (ref 65–99)
Glucose-Capillary: 126 mg/dL — ABNORMAL HIGH (ref 65–99)
Glucose-Capillary: 129 mg/dL — ABNORMAL HIGH (ref 65–99)

## 2017-12-04 LAB — PROCALCITONIN: PROCALCITONIN: 3.04 ng/mL

## 2017-12-04 LAB — TROPONIN I: TROPONIN I: 0.13 ng/mL — AB (ref <0.03)

## 2017-12-04 MED ORDER — VITAL 1.5 CAL PO LIQD
1000.0000 mL | ORAL | Status: DC
  Administered 2017-12-04 – 2017-12-05 (×2): 1000 mL

## 2017-12-04 MED ORDER — PRO-STAT SUGAR FREE PO LIQD
60.0000 mL | Freq: Two times a day (BID) | ORAL | Status: DC
  Administered 2017-12-04 – 2017-12-06 (×4): 60 mL

## 2017-12-04 MED ORDER — STERILE WATER FOR INJECTION IJ SOLN
INTRAMUSCULAR | Status: AC
  Filled 2017-12-04: qty 10

## 2017-12-04 MED ORDER — FENTANYL 2500MCG IN NS 250ML (10MCG/ML) PREMIX INFUSION
0.0000 ug/h | INTRAVENOUS | Status: DC
  Administered 2017-12-04 – 2017-12-06 (×3): 150 ug/h via INTRAVENOUS
  Filled 2017-12-04 (×3): qty 250

## 2017-12-04 MED ORDER — PRO-STAT SUGAR FREE PO LIQD: 30.0000 mL | Freq: Two times a day (BID) | ORAL | Status: DC

## 2017-12-04 MED ORDER — VECURONIUM BROMIDE 10 MG IV SOLR
INTRAVENOUS | Status: AC
  Administered 2017-12-04: 10 mg
  Filled 2017-12-04: qty 10

## 2017-12-04 MED ORDER — AMIODARONE HCL 200 MG PO TABS
400.0000 mg | ORAL_TABLET | Freq: Two times a day (BID) | ORAL | Status: DC
  Administered 2017-12-04 – 2017-12-06 (×5): 400 mg
  Filled 2017-12-04 (×5): qty 2

## 2017-12-04 MED ORDER — STERILE WATER FOR INJECTION IJ SOLN
INTRAMUSCULAR | Status: AC
  Administered 2017-12-04: 10 mL
  Filled 2017-12-04: qty 10

## 2017-12-04 MED ORDER — VITAL HIGH PROTEIN PO LIQD: 1000.0000 mL | ORAL | Status: DC

## 2017-12-04 MED ORDER — STERILE WATER FOR INJECTION IJ SOLN
INTRAMUSCULAR | Status: AC
  Administered 2017-12-04: 13:00:00
  Filled 2017-12-04: qty 10

## 2017-12-04 MED ORDER — VECURONIUM BROMIDE 10 MG IV SOLR
10.0000 mg | INTRAVENOUS | Status: DC | PRN
  Administered 2017-12-04 – 2017-12-05 (×3): 10 mg via INTRAVENOUS
  Filled 2017-12-04 (×3): qty 10

## 2017-12-04 MED ORDER — FAMOTIDINE 20 MG PO TABS
20.0000 mg | ORAL_TABLET | Freq: Two times a day (BID) | ORAL | Status: DC
  Administered 2017-12-04 – 2017-12-05 (×2): 20 mg
  Filled 2017-12-04 (×2): qty 1

## 2017-12-04 MED ORDER — METOPROLOL TARTRATE 5 MG/5ML IV SOLN
2.5000 mg | INTRAVENOUS | Status: DC | PRN
  Administered 2017-12-04 (×3): 5 mg via INTRAVENOUS
  Filled 2017-12-04 (×3): qty 5

## 2017-12-04 MED ORDER — ADULT MULTIVITAMIN LIQUID CH
15.0000 mL | Freq: Every day | ORAL | Status: DC
  Administered 2017-12-04 – 2017-12-06 (×3): 15 mL
  Filled 2017-12-04 (×4): qty 15

## 2017-12-04 NOTE — Progress Notes (Signed)
Prior to interventions, pt was ruddy in face, tachypneic RR 32-40/min, respirations labored, using accessory muscles to breathe. Propofol gtt was increased to max and prn fentanyl given, RT in to make adjustments. With increase in sedation, levophed was added to maintain MAP greater than 65.  Pt seen by Dr Alva Garnet now. Fentanyl gtt restarted and increased to 150 mcg. And propofol decreased to 73mcg per order. Dr Alva Garnet requests propofol be titrated down and fentanyl gtt used for sedation as able. On initial exam, pt was noted to have a  weak cough effort, withdraw to tickling soles of feet, and slight furrow of brow. No eye opening. No gag noted. No movement of upper extremities.

## 2017-12-04 NOTE — Progress Notes (Signed)
PULMONARY / CRITICAL CARE MEDICINE   Name: Chad Hess MRN: [id number] DOB: 25-Mar-1951    ADMISSION DATE:  12/14/2017  PT PROFILE:   89 M smoker with O2 dependent COPD, CAF suffered OOH cardiac arrest with 27 mins ACLS by EMS after experiencing worsening dyspnea over 1-2 days prior.  Initial rhythm was PEA.  Intubated in field. TTM (36 C) protocol initiated in ED. Levetiracetam initiated in ED for myoclonic activity  MAJOR EVENTS/TEST RESULTS: 01/14 as above. Target T reached @ 0100 on 01/15 01/14 CT head: No definite CT evidence for acute intracranial abnormality 01/15 Cardiology consultation: PEA arrest felt to be due to respiratory failure and not a primary cardiac event. 01/15 Unresponsive on propofol infusion with frequent myoclonus. Levetiracetam changed to VPA 01/15 EEG: abnormal EEG due to a burst suppression activity 01/15 Echocardiogram: LVEF 55-65%.  Mild to moderate MR.  LA severely dilated.  Moderate to severe pulmonary hypertension (RVSP estimated 45-55 mmHg) 01/15 Pt made DNR in event of recurrent cardiac arrest 01/16 Rewarmed. Remains severely encephalopathic. Fentanyl infusion required for vent dyssynchrony  INDWELLING DEVICES:: ETT 01/14 >>  R femoral CVL 01/15 >>   MICRO DATA: MRSA PCR 01/14 >> NEG Urine 01/14 >> NEG Resp 01/14 >>  Blood 01/14 >>   PCT 01/15: 2.09, 3.01, 3.04  ANTIMICROBIALS:   Cefepime 01/15 >>     SUBJECTIVE:  Intubated, sedated on fentanyl. Myoclonus resolved.  Unresponsive.  VITAL SIGNS: BP 139/72   Pulse (!) 125   Temp 98.4 F (36.9 C)   Resp 15   Ht 5\' 11"  (1.803 m)   Wt 108.9 kg (240 lb)   SpO2 90%   BMI 33.47 kg/m   HEMODYNAMICS: CVP:  [6 mmHg-23 mmHg] 16 mmHg  VENTILATOR SETTINGS: Vent Mode: PCV FiO2 (%):  [24 %-35 %] 24 % Set Rate:  [18 bmp] 18 bmp PEEP:  [5 cmH20] 5 cmH20  INTAKE / OUTPUT: I/O last 3 completed shifts: In: 2899.7 [I.V.:2749.7; IV Piggyback:150] Out: 815 [Urine:815]  PHYSICAL  EXAMINATION: General: Intubated, sedated Neuro: Unresponsive, pupils pinpoint, no spontaneous movement HEENT: NCAT, sclerae white Cardiovascular: IRIR, intermittently tachy, no M noted Lungs: Distant BS, coarse BS, prolonged exp wheezes Abdomen: Soft, diminished BS, no palpable masses Extremities: Cool, no edema Skin: No lesions noted  LABS:  BMET Recent Labs  Lab 12/03/17 2009 12/04/17 0001 12/04/17 0356  NA 139 138 138  K 4.1 4.1 4.1  CL 107 106 106  CO2 20* 20* 19*  BUN 33* 33* 34*  CREATININE 1.85* 1.77* 1.86*  GLUCOSE 160* 132* 119*    Electrolytes Recent Labs  Lab 12/03/17 0057 12/03/17 0522  12/03/17 2009 12/04/17 0001 12/04/17 0356  CALCIUM 8.0* 8.0*   < > 8.2* 8.2* 8.2*  MG 2.2 2.1  --   --   --   --   PHOS  --  3.0  --   --   --   --    < > = values in this interval not displayed.    CBC Recent Labs  Lab 12/05/2017 1750 12/03/17 0522 12/04/17 0356  WBC 19.8* 24.1* 22.2*  HGB 14.6 13.7 12.5*  HCT 44.6 40.7 37.5*  PLT 179 156 123*    Coag's Recent Labs  Lab 12/03/17 0057 12/03/17 2009  APTT 30 34  INR 1.11 1.13    Sepsis Markers Recent Labs  Lab 12/13/2017 1750 12/03/17 0057 12/03/17 0522 12/04/17 0356  LATICACIDVEN 6.5* 1.4  --   --   PROCALCITON  --  2.09 3.01 3.04    ABG Recent Labs  Lab 12/01/2017 2004 12/03/17 0500  PHART 7.22* 7.27*  PCO2ART 60* 53*  PO2ART 119* 146*    Liver Enzymes Recent Labs  Lab 12/08/2017 1750 12/04/17 0356  AST 76* 62*  ALT 47 34  ALKPHOS 90 58  BILITOT 0.9 0.7  ALBUMIN 3.6 2.9*    Cardiac Enzymes Recent Labs  Lab 12/03/17 0057 12/03/17 0522 12/04/17 0356  TROPONINI 0.46* 0.37* 0.13*    Glucose Recent Labs  Lab 12/03/17 1605 12/03/17 1924 12/04/17 0000 12/04/17 0326 12/04/17 0745 12/04/17 1207  GLUCAP 136* 135* 106* 106* 112* 126*    CXR: R basilar atx    ASSESSMENT / PLAN:  PULMONARY A: Smoker Severe baseline COPD Acute COPD exacerbation Acute on chronic  respiratory  S/P respiratory arrest P:   Cont vent support - settings reviewed and/or adjusted Cont vent bundle Daily SBT if/when meets criteria  Cont nebulized steroids and BDs  CARDIOVASCULAR A:  Chronic atrial fibrillation Cardiac arrest P:  Monitor BP and rhythm Continue amiodarone load - changed to enteral 01/16 DNR in event of recurrent arrest   RENAL A:   CKD AKI with oliguria P:   Monitor BMET intermittently Monitor I/Os Correct electrolytes as indicated   GASTROINTESTINAL A:   No acute issues P:   SUP: enteral famotidine Initiate TF protocol 01/16  HEMATOLOGIC A:   No acute issues P:  DVT px: SQ heparin Monitor CBC intermittently Transfuse per usual guidelines   INFECTIOUS A:   Elevated PCT without other evidence of acute infection P:   Monitor temp, WBC count Micro and abx as above   ENDOCRINE A:   Hyperglycemia, stress-induced - resolved P:   Continue SSI  NEUROLOGIC A:   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  Guarded prognosis re-iterated   CCM time: 40 mins  The above time includes time spent in consultation with patient and/or family members and reviewing care plan on multidisciplinary rounds  Merton Border, MD PCCM service Mobile [phone] Pager [phone] 12/04/2017, 2:59 PM

## 2017-12-04 NOTE — Progress Notes (Addendum)
Nutrition Follow-up  DOCUMENTATION CODES:   Not applicable  INTERVENTION:  Recommend initiating Vital 1.5 at 40 mL/hr (960 mL goal daily volume) + Pro-Stat 60 mL BID via OGT. Provides 1840 kcal, 125 grams of protein, 730 mL H2O daily.  Provide liquid MVI daily per tube.  NUTRITION DIAGNOSIS:   Inadequate oral intake related to inability to eat as evidenced by NPO status.  Ongoing.  GOAL:   Provide needs based on ASPEN/SCCM guidelines  Addressing with initiation of tube feeds today.  MONITOR:   Vent status, Labs, Weight trends, TF tolerance, I & O's  REASON FOR ASSESSMENT:   Ventilator, Consult Enteral/tube feeding initiation and management  ASSESSMENT:   67 year old male with PMHx of A-fib, CAD, COPD, diverticulitis, ED, GERD, HLD, HTN, PUD, pulmonary hypertension, sleep apnea noncompliant with CPAP, CHF who presented following PEA arrest with estimated downtime of 30 minutes before ROSC and was intubated in the field on 1/14, initiated on 36C temperature management protocol. Patient having myoclonus.  -Code status was changed to DNR yesterday. -Patient started re-warming at 0100 1/16 and reached re-warming target temperature at 0500 1/16.  Access: OGT placed 1/15; terminates in distal stomach per abdominal x-ray 1/15; 70 cm at corner of mouth  MAP: 62-87 mmHg  Patient is currently intubated on ventilator support MV: 13.9 L/min Temp (24hrs), Avg:97.5 F (36.4 C), Min:95.9 F (35.5 C), Max:99 F (37.2 C)  Propofol: N/A  Medications reviewed and include: famotidine, Novolog 0-9 units Q4hrs, cefepime, fentanyl gtt, LR @ 50 mL/hr, norepinephrine gtt (4 mcg/min), valproate.  Labs reviewed: CBG 106-126, CO2 19, BUN 34, Creatinine 1.86.  Discussed with RN. Patient is now off of propofol.  Diet Order:  No diet orders on file  EDUCATION NEEDS:   No education needs have been identified at this time  Skin:  Skin Assessment: Reviewed RN Assessment  Last BM:   12/03/2016 (BM characteristics not documented)  Height:   Ht Readings from Last 1 Encounters:  11/23/2017 5\' 11"  (1.803 m)    Weight:   Wt Readings from Last 1 Encounters:  11/30/2017 240 lb (108.9 kg)    Ideal Body Weight:  78.2 kg  BMI:  Body mass index is 33.47 kg/m.  Estimated Nutritional Needs:   Kcal:  1900 (PSU 2003b w/ MSJ 1632, Ve 11.8, Tmax 37)  Protein:  100-124 grams (1.2-1.5 grams/kg)  Fluid:  2 L/day (25 mL/kg)  Willey Blade, MS, RD, LDN Office: [phone] Pager: [phone] After Hours/Weekend Pager: [phone]

## 2017-12-04 NOTE — Progress Notes (Signed)
[facility]  SUBJECTIVE: Intubated   Vitals:   12/04/17 0810 12/04/17 0815 12/04/17 0830 12/04/17 0845  BP: (!) 80/54 101/62 114/64 102/68  Pulse: (!) 107 (!) 107 (!) 104 (!) 106  Resp: (!) 30 (!) 27 (!) 26 (!) 21  Temp:   98.6 F (37 C)   TempSrc:   Rectal   SpO2: 99% 100% 100% 100%  Weight:      Height:         Intake/Output Summary (Last 24 hours) at 12/04/2017 0905 Last data filed at 12/04/2017 0800 Gross per 24 hour  Intake 2181.15 ml  Output 740 ml  Net 1441.15 ml      PHYSICAL EXAM  General: Intubated HEENT:  Normocephalic and atramatic Neck:  No JVD.  Lungs: Clear bilaterally to auscultation and percussion. Heart: HRRR . Normal S1 and S2 without gallops or murmurs.  Abdomen: Bowel sounds are positive, abdomen soft and non-tender  Msk:  Back normal, normal gait. Normal strength and tone for age. Extremities: No clubbing, cyanosis or edema.   Neuro: Intubated Psych: Unresponsive   LABS: Basic Metabolic Panel: Recent Labs    12/03/17 0057 12/03/17 0522  12/04/17 0001 12/04/17 0356  NA 139 140   < > 138 138  K 6.4* 4.2   < > 4.1 4.1  CL 105 106   < > 106 106  CO2 25 25   < > 20* 19*  GLUCOSE 206* 236*   < > 132* 119*  BUN 26* 28*   < > 33* 34*  CREATININE 1.69* 1.70*   < > 1.77* 1.86*  CALCIUM 8.0* 8.0*   < > 8.2* 8.2*  MG 2.2 2.1  --   --   --   PHOS  --  3.0  --   --   --    < > = values in this interval not displayed.   Liver Function Tests: Recent Labs    12/19/2017 1750 12/04/17 0356  AST 76* 62*  ALT 47 34  ALKPHOS 90 58  BILITOT 0.9 0.7  PROT 6.2* 5.4*  ALBUMIN 3.6 2.9*   No results for input(s): LIPASE, AMYLASE in the last 72 hours. CBC: Recent Labs    12/19/2017 1750 12/03/17 0522 12/04/17 0356  WBC 19.8* 24.1* 22.2*  NEUTROABS 12.1*  --   --   HGB 14.6 13.7 12.5*  HCT 44.6 40.7 37.5*  MCV 100.8* 98.7 97.6  PLT 179 156 123*   Cardiac Enzymes: Recent Labs    12/03/17 0057 12/03/17 0522 12/04/17 0356  TROPONINI  0.46* 0.37* 0.13*   BNP: Invalid input(s): POCBNP D-Dimer: No results for input(s): DDIMER in the last 72 hours. Hemoglobin A1C: No results for input(s): HGBA1C in the last 72 hours. Fasting Lipid Panel: Recent Labs    12/03/17 0057  TRIG 87   Thyroid Function Tests: No results for input(s): TSH, T4TOTAL, T3FREE, THYROIDAB in the last 72 hours.  Invalid input(s): FREET3 Anemia Panel: No results for input(s): VITAMINB12, FOLATE, FERRITIN, TIBC, IRON, RETICCTPCT in the last 72 hours.  Dg Abd 1 View  Result Date: 12/03/2017 CLINICAL DATA:  Orogastric tube placement. EXAM: ABDOMEN - 1 VIEW COMPARISON:  Radiographs of December 02, 2017. FINDINGS: Mild gastric distention is noted. Distal tip of orogastric tube is seen in expected position of distal stomach. No other bowel dilatation is noted. IMPRESSION: Distal tip of orogastric tube seen in distal portion of stomach, which is mildly distended. Electronically Signed   By: Marijo Conception,  M.D.   On: 12/03/2017 08:21   Dg Abd 1 View  Result Date: 12/03/2017 CLINICAL DATA:  Orogastric tube placement. EXAM: ABDOMEN - 1 VIEW COMPARISON:  None. FINDINGS: No orogastric tube within field of view. Gas distended stomach, 4.9 cm gas distended probable transverse colon. No intra-abdominal mass effect or pathologic calcifications. RIGHT femoral central venous catheter distal tip projects at the level of L5. soft tissue planes and included osseous structures are non suspicious. Moderate vascular calcifications. IMPRESSION: Gas distended stomach.  No orogastric tube within field of view. RIGHT femoral central venous catheter distal tip projecting at L5. Electronically Signed   By: Elon Alas M.D.   On: 12/03/2017 00:44   Ct Head Wo Contrast  Result Date: 11/29/2017 CLINICAL DATA:  Seizure, CPR EXAM: CT HEAD WITHOUT CONTRAST TECHNIQUE: Contiguous axial images were obtained from the base of the skull through the vertex without intravenous contrast.  COMPARISON:  None. FINDINGS: Brain: No acute territorial infarction, hemorrhage or intracranial mass is visualized. Mild asymmetric hypodensity in the left pons and cerebellar peduncle, suspected to be secondary to streak artifact given appearance on coronal and sagittal views. The ventricles are nonenlarged. Mild dilatation of anterior extra-axial CSF spaces. Vascular: No hyperdense vessels.  Carotid artery calcification Skull: No fracture.  Small fluid in the right mastoid Sinuses/Orbits: Fluid levels in the sphenoid sinuses. Mucosal thickening in the ethmoid sinuses. No acute orbital abnormality Other: None IMPRESSION: No definite CT evidence for acute intracranial abnormality. Electronically Signed   By: Donavan Foil M.D.   On: 12/17/2017 18:31   Dg Chest Port 1 View  Result Date: 12/04/2017 CLINICAL DATA:  Hypoxia EXAM: PORTABLE CHEST 1 VIEW COMPARISON:  December 03, 2017 FINDINGS: Endotracheal tube tip is 3.8 cm above the carina. Nasogastric tube tip and side port are below the diaphragm. There is no evident pneumothorax. There is a right pleural effusion with atelectasis in the right base. The lungs elsewhere are clear. Heart is mildly enlarged with pulmonary vascularity within normal limits. There is aortic atherosclerosis. No adenopathy. No bone lesions. IMPRESSION: Tube positions as described without pneumothorax. Right pleural effusion with right base atelectasis. Lungs elsewhere clear. Stable cardiac prominence. There is aortic atherosclerosis. Aortic Atherosclerosis (ICD10-I70.0). Electronically Signed   By: Lowella Grip III M.D.   On: 12/04/2017 07:29   Dg Chest Port 1 View  Result Date: 12/03/2017 CLINICAL DATA:  Central line attempt. EXAM: PORTABLE CHEST 1 VIEW COMPARISON:  Chest radiograph December 02, 2017 FINDINGS: Cardiac silhouette is mildly enlarged. Mildly calcified aortic knob. No pleural effusion or focal consolidation. Mild increased lung volumes. Mild bronchitic changes. No  pneumothorax. Endotracheal tube tip projects 5.1 cm above the carina. Soft tissue planes and included osseous structures are unchanged. IMPRESSION: No central line within field of view. No pneumothorax. Endotracheal tube tip projects 5.1 cm above the carina. Mild cardiomegaly and probable COPD. Electronically Signed   By: Elon Alas M.D.   On: 12/03/2017 00:38   Dg Chest Portable 1 View  Result Date: 12/05/2017 CLINICAL DATA:  Post intubation and CPR. EXAM: PORTABLE CHEST 1 VIEW COMPARISON:  10/23/2017; 07/28/2017; 07/26/2017 FINDINGS: Grossly unchanged borderline enlarged cardiac silhouette and mediastinal contours with atherosclerotic plaque within the thoracic aorta. There is persistent thickening the right paratracheal stripe, presumably secondary to prominent vasculature. Endotracheal tube overlies the tracheal air column with tip approximately 3.5 cm above the carina. Epicardial pacer leads overlie the cardiac apex. No supine evidence of pneumothorax or pleural effusion. Overall improved aeration of the lungs. No  focal airspace opacities. No evidence of edema. No acute osseus abnormalities. IMPRESSION: 1. Appropriately positioned support apparatus. No supine evidence of pneumothorax. 2. No focal airspace opacities or evidence of edema. Electronically Signed   By: Sandi Mariscal M.D.   On: 11/22/2017 18:16     Echo normal LV function, LVEF 55-65%  TELEMETRY: Atrial fibrillation with controlled ventricular rate  ASSESSMENT AND PLAN:  Active Problems:   Cardiac arrest ([place])    1.  Status post PEA arrest, in the setting of respiratory failure, likely noncardiac with minimal troponin elevation, unremarkable ECG, no evidence of ventricular arrhythmias, with normal left ventricular function by 2D echocardiogram 2.  Known history of chronic atrial fibrillation, with controlled ventricular rate on amiodarone drip  Recommendations  1.  Agree with current therapy 2.  Defer cardiac  catheterization 3.  Defer further cardiac diagnostics at this time 4.  Continue amiodarone drip for now for rate control  Sign off for now, please call if any questions   Isaias Cowman, MD, PhD, [facility] 12/04/2017 9:05 AM

## 2017-12-04 NOTE — Progress Notes (Signed)
Patient began rewarming at 0100 and reached the goal temp of 37* at 0500. Son at bedside and updated.

## 2017-12-04 NOTE — Progress Notes (Signed)
[facility] [place] NAME: Chad Hess    MR#:  [id number]  DATE OF BIRTH:  [birth year]  SUBJECTIVE:  CHIEF COMPLAINT:   Chief Complaint  Patient presents with  . Cardiac Arrest   - Patient admitted for PE arrest, was on hypothermia protocol until this morning. Just started rewarming. -Tachycardic, and amiodarone drip. -On fentanyl IV for sedation.  REVIEW OF SYSTEMS:  Review of Systems  Unable to perform ROS: Critical illness    DRUG ALLERGIES:   Allergies  Allergen Reactions  . Chantix [Varenicline]     VITALS:  Blood pressure 139/72, pulse (!) 125, temperature 98.2 F (36.8 C), resp. rate 15, height 5\' 11"  (1.803 m), weight 108.9 kg (240 lb), SpO2 90 %.  PHYSICAL EXAMINATION:  Physical Exam  GENERAL:  67 y.o.-year-old patient lying in the bed, critically ill appearing.  EYES: Pupils equal, round, reactive to light and accommodation. No scleral icterus. Extraocular muscles intact.  HEENT: Head atraumatic, normocephalic. Oropharynx and nasopharynx clear.  NECK:  Supple, no jugular venous distention. No thyroid enlargement, no tenderness.  LUNGS: Normal breath sounds bilaterally, no wheezing, rales,rhonchi or crepitation. No use of accessory muscles of respiration. Decreased bibasilar breath sounds CARDIOVASCULAR: S1, S2 normal. No murmurs, rubs, or gallops.  ABDOMEN: Soft, nontender, nondistended. Bowel sounds present. No organomegaly or mass.  EXTREMITIES: No pedal edema, cyanosis, or clubbing.  NEUROLOGIC: sedated, was withdrawing to pain this AM in lower extremities and had a weak cough and breathing over vent.  PSYCHIATRIC: The patient is sedated  SKIN: No obvious rash, lesion, or ulcer.    LABORATORY PANEL:   CBC Recent Labs  Lab 12/04/17 0356  WBC 22.2*  HGB 12.5*  HCT 37.5*  PLT 123*    ------------------------------------------------------------------------------------------------------------------  Chemistries  Recent Labs  Lab 12/03/17 0522  12/04/17 0356  NA 140   < > 138  K 4.2   < > 4.1  CL 106   < > 106  CO2 25   < > 19*  GLUCOSE 236*   < > 119*  BUN 28*   < > 34*  CREATININE 1.70*   < > 1.86*  CALCIUM 8.0*   < > 8.2*  MG 2.1  --   --   AST  --   --  62*  ALT  --   --  34  ALKPHOS  --   --  58  BILITOT  --   --  0.7   < > = values in this interval not displayed.   ------------------------------------------------------------------------------------------------------------------  Cardiac Enzymes Recent Labs  Lab 12/04/17 0356  TROPONINI 0.13*   ------------------------------------------------------------------------------------------------------------------  RADIOLOGY:  Dg Abd 1 View  Result Date: 12/03/2017 CLINICAL DATA:  Orogastric tube placement. EXAM: ABDOMEN - 1 VIEW COMPARISON:  Radiographs of December 02, 2017. FINDINGS: Mild gastric distention is noted. Distal tip of orogastric tube is seen in expected position of distal stomach. No other bowel dilatation is noted. IMPRESSION: Distal tip of orogastric tube seen in distal portion of stomach, which is mildly distended. Electronically Signed   By: Marijo Conception, M.D.   On: 12/03/2017 08:21   Dg Abd 1 View  Result Date: 12/03/2017 CLINICAL DATA:  Orogastric tube placement. EXAM: ABDOMEN - 1 VIEW COMPARISON:  None. FINDINGS: No orogastric tube within field of view. Gas distended stomach, 4.9 cm gas distended probable transverse colon. No intra-abdominal mass effect or pathologic calcifications. RIGHT femoral central venous catheter distal tip projects at  the level of L5. soft tissue planes and included osseous structures are non suspicious. Moderate vascular calcifications. IMPRESSION: Gas distended stomach.  No orogastric tube within field of view. RIGHT femoral central venous catheter distal  tip projecting at L5. Electronically Signed   By: Elon Alas M.D.   On: 12/03/2017 00:44   Ct Head Wo Contrast  Result Date: 11/25/2017 CLINICAL DATA:  Seizure, CPR EXAM: CT HEAD WITHOUT CONTRAST TECHNIQUE: Contiguous axial images were obtained from the base of the skull through the vertex without intravenous contrast. COMPARISON:  None. FINDINGS: Brain: No acute territorial infarction, hemorrhage or intracranial mass is visualized. Mild asymmetric hypodensity in the left pons and cerebellar peduncle, suspected to be secondary to streak artifact given appearance on coronal and sagittal views. The ventricles are nonenlarged. Mild dilatation of anterior extra-axial CSF spaces. Vascular: No hyperdense vessels.  Carotid artery calcification Skull: No fracture.  Small fluid in the right mastoid Sinuses/Orbits: Fluid levels in the sphenoid sinuses. Mucosal thickening in the ethmoid sinuses. No acute orbital abnormality Other: None IMPRESSION: No definite CT evidence for acute intracranial abnormality. Electronically Signed   By: Donavan Foil M.D.   On: 11/23/2017 18:31   Dg Chest Port 1 View  Result Date: 12/04/2017 CLINICAL DATA:  Hypoxia EXAM: PORTABLE CHEST 1 VIEW COMPARISON:  December 03, 2017 FINDINGS: Endotracheal tube tip is 3.8 cm above the carina. Nasogastric tube tip and side port are below the diaphragm. There is no evident pneumothorax. There is a right pleural effusion with atelectasis in the right base. The lungs elsewhere are clear. Heart is mildly enlarged with pulmonary vascularity within normal limits. There is aortic atherosclerosis. No adenopathy. No bone lesions. IMPRESSION: Tube positions as described without pneumothorax. Right pleural effusion with right base atelectasis. Lungs elsewhere clear. Stable cardiac prominence. There is aortic atherosclerosis. Aortic Atherosclerosis (ICD10-I70.0). Electronically Signed   By: Lowella Grip III M.D.   On: 12/04/2017 07:29   Dg Chest  Port 1 View  Result Date: 12/03/2017 CLINICAL DATA:  Central line attempt. EXAM: PORTABLE CHEST 1 VIEW COMPARISON:  Chest radiograph December 02, 2017 FINDINGS: Cardiac silhouette is mildly enlarged. Mildly calcified aortic knob. No pleural effusion or focal consolidation. Mild increased lung volumes. Mild bronchitic changes. No pneumothorax. Endotracheal tube tip projects 5.1 cm above the carina. Soft tissue planes and included osseous structures are unchanged. IMPRESSION: No central line within field of view. No pneumothorax. Endotracheal tube tip projects 5.1 cm above the carina. Mild cardiomegaly and probable COPD. Electronically Signed   By: Elon Alas M.D.   On: 12/03/2017 00:38   Dg Chest Portable 1 View  Result Date: 11/28/2017 CLINICAL DATA:  Post intubation and CPR. EXAM: PORTABLE CHEST 1 VIEW COMPARISON:  10/23/2017; 07/28/2017; 07/26/2017 FINDINGS: Grossly unchanged borderline enlarged cardiac silhouette and mediastinal contours with atherosclerotic plaque within the thoracic aorta. There is persistent thickening the right paratracheal stripe, presumably secondary to prominent vasculature. Endotracheal tube overlies the tracheal air column with tip approximately 3.5 cm above the carina. Epicardial pacer leads overlie the cardiac apex. No supine evidence of pneumothorax or pleural effusion. Overall improved aeration of the lungs. No focal airspace opacities. No evidence of edema. No acute osseus abnormalities. IMPRESSION: 1. Appropriately positioned support apparatus. No supine evidence of pneumothorax. 2. No focal airspace opacities or evidence of edema. Electronically Signed   By: Sandi Mariscal M.D.   On: 11/30/2017 18:16    EKG:   Orders placed or performed during the hospital encounter of 12/13/2017  . EKG  12-Lead  . EKG 12-Lead  . EKG 12-Lead  . EKG 12-Lead  . EKG 12-Lead  . EKG 12-Lead    ASSESSMENT AND PLAN:   67 year old male with past medical history significant for atrial  fibrillation, COPD, congestive heart failure, CAD, GERD, hypertension and sleep apnea presents to hospital after cardiac arrest and hypoxic respiratory failure.   1. Cardiac arrest- PEA arrest from hypoxia -was on hypothermia protocol, started rewarming - If no improvement in neurological status- CT head tomorrow  2. Acute hypoxic respiratory failure - due to acute COPD exacerbation - mgmt per pulmonary -on vent, steroids and nebs- steroids discontinued by ICU team  3. Chronic afib- with rapid rvr- on amiodarone drip- change to oral today -appreciate cards consult  4. Sepsis- ? pneumonia - off pressors now, wbc elevated - Continue cefepime for now  5. ARF- ATN from sepsis, cardiac arrest Monitor  6. DVT Prophylaxis- SQ heparin    All the records are reviewed and case discussed with Care Management/Social Workerr. Management plans discussed with the patient, family and they are in agreement.  CODE STATUS: DNR  TOTAL TIME TAKING CARE OF THIS PATIENT: 39 minutes.   POSSIBLE D/C IN ?  DAYS, DEPENDING ON CLINICAL CONDITION.   Gladstone Lighter M.D on 12/04/2017 at 1:08 PM  Between 7am to 6pm - Pager - [phone]  After 6pm go to [email or web address] - password [email or web address] [facility]  Office  [phone]  CC: Primary care physician; Alanson Aly, [place]

## 2017-12-05 ENCOUNTER — Inpatient Hospital Stay: Payer: Medicare HMO

## 2017-12-05 DIAGNOSIS — N179 Acute kidney failure, unspecified: Secondary | ICD-10-CM

## 2017-12-05 DIAGNOSIS — I482 Chronic atrial fibrillation: Secondary | ICD-10-CM

## 2017-12-05 LAB — COMPREHENSIVE METABOLIC PANEL
ALT: 28 U/L (ref 17–63)
ANION GAP: 9 (ref 5–15)
AST: 57 U/L — ABNORMAL HIGH (ref 15–41)
Albumin: 2.8 g/dL — ABNORMAL LOW (ref 3.5–5.0)
Alkaline Phosphatase: 59 U/L (ref 38–126)
BUN: 40 mg/dL — ABNORMAL HIGH (ref 6–20)
CHLORIDE: 104 mmol/L (ref 101–111)
CO2: 25 mmol/L (ref 22–32)
CREATININE: 2.33 mg/dL — AB (ref 0.61–1.24)
Calcium: 8 mg/dL — ABNORMAL LOW (ref 8.9–10.3)
GFR, EST AFRICAN AMERICAN: 32 mL/min — AB (ref >60)
GFR, EST NON AFRICAN AMERICAN: 27 mL/min — AB (ref >60)
Glucose, Bld: 141 mg/dL — ABNORMAL HIGH (ref 65–99)
POTASSIUM: 5 mmol/L (ref 3.5–5.1)
SODIUM: 138 mmol/L (ref 135–145)
Total Bilirubin: 0.7 mg/dL (ref 0.3–1.2)
Total Protein: 5.5 g/dL — ABNORMAL LOW (ref 6.5–8.1)

## 2017-12-05 LAB — CBC
HCT: 38.1 % — ABNORMAL LOW (ref 40.0–52.0)
Hemoglobin: 12.6 g/dL — ABNORMAL LOW (ref 13.0–18.0)
MCH: 32.9 pg (ref 26.0–34.0)
MCHC: 33.1 g/dL (ref 32.0–36.0)
MCV: 99.2 fL (ref 80.0–100.0)
PLATELETS: 120 10*3/uL — AB (ref 150–440)
RBC: 3.84 MIL/uL — AB (ref 4.40–5.90)
RDW: 14.7 % — AB (ref 11.5–14.5)
WBC: 19 10*3/uL — ABNORMAL HIGH (ref 3.8–10.6)

## 2017-12-05 LAB — GLUCOSE, CAPILLARY
GLUCOSE-CAPILLARY: 125 mg/dL — AB (ref 65–99)
GLUCOSE-CAPILLARY: 110 mg/dL — AB (ref 65–99)
GLUCOSE-CAPILLARY: 112 mg/dL — AB (ref 65–99)
GLUCOSE-CAPILLARY: 156 mg/dL — AB (ref 65–99)
GLUCOSE-CAPILLARY: 121 mg/dL — AB (ref 65–99)

## 2017-12-05 MED ORDER — METOPROLOL TARTRATE 25 MG PO TABS
25.0000 mg | ORAL_TABLET | Freq: Two times a day (BID) | ORAL | Status: DC
  Administered 2017-12-05 – 2017-12-06 (×3): 25 mg
  Filled 2017-12-05 (×3): qty 1

## 2017-12-05 MED ORDER — STERILE WATER FOR INJECTION IJ SOLN
INTRAMUSCULAR | Status: AC
  Administered 2017-12-05: 10:00:00
  Filled 2017-12-05: qty 10

## 2017-12-05 MED ORDER — FAMOTIDINE 20 MG PO TABS
20.0000 mg | ORAL_TABLET | Freq: Every day | ORAL | Status: DC
  Administered 2017-12-06: 20 mg
  Filled 2017-12-05: qty 1

## 2017-12-05 NOTE — Progress Notes (Signed)
[facility] [place] NAME: Chad Hess    MR#:  [id number]  DATE OF BIRTH:  09-23-1951  SUBJECTIVE:  CHIEF COMPLAINT:   Chief Complaint  Patient presents with  . Cardiac Arrest   - Patient admitted for PEA arrest, was on hypothermia protocol until 12/04/17. -Tachycardic, off pressors, minimal neurologic response - CT head today.  REVIEW OF SYSTEMS:  Review of Systems  Unable to perform ROS: Critical illness    DRUG ALLERGIES:   Allergies  Allergen Reactions  . Chantix [Varenicline]     VITALS:  Blood pressure 134/76, pulse (!) 110, temperature 98.8 F (37.1 C), temperature source Rectal, resp. rate (!) 22, height 5\' 11"  (1.803 m), weight 108.9 kg (240 lb), SpO2 95 %.  PHYSICAL EXAMINATION:  Physical Exam  GENERAL:  67 y.o.-year-old patient lying in the bed, critically ill appearing.  EYES: Pupils equal, round, reactive to light and accommodation. No scleral icterus. Extraocular muscles intact.  HEENT: Head atraumatic, normocephalic. Oropharynx and nasopharynx clear.  NECK:  Supple, no jugular venous distention. No thyroid enlargement, no tenderness.  LUNGS: Normal breath sounds bilaterally, no wheezing, rales,rhonchi or crepitation. No use of accessory muscles of respiration. Decreased bibasilar breath sounds CARDIOVASCULAR: S1, S2 normal. No murmurs, rubs, or gallops.  ABDOMEN: Soft, nontender, nondistended. Bowel sounds present. No organomegaly or mass.  EXTREMITIES: No pedal edema, cyanosis, or clubbing.  NEUROLOGIC: sedated, was withdrawing to pain this AM in lower extremities and had a weak cough and breathing over vent.  PSYCHIATRIC: The patient is sedated  SKIN: No obvious rash, lesion, or ulcer.    LABORATORY PANEL:   CBC Recent Labs  Lab 12/05/17 0425  WBC 19.0*  HGB 12.6*  HCT 38.1*  PLT 120*    ------------------------------------------------------------------------------------------------------------------  Chemistries  Recent Labs  Lab 12/03/17 0522  12/05/17 0425  NA 140   < > 138  K 4.2   < > 5.0  CL 106   < > 104  CO2 25   < > 25  GLUCOSE 236*   < > 141*  BUN 28*   < > 40*  CREATININE 1.70*   < > 2.33*  CALCIUM 8.0*   < > 8.0*  MG 2.1  --   --   AST  --    < > 57*  ALT  --    < > 28  ALKPHOS  --    < > 59  BILITOT  --    < > 0.7   < > = values in this interval not displayed.   ------------------------------------------------------------------------------------------------------------------  Cardiac Enzymes Recent Labs  Lab 12/04/17 0356  TROPONINI 0.13*   ------------------------------------------------------------------------------------------------------------------  RADIOLOGY:  Dg Chest Port 1 View  Result Date: 12/05/2017 CLINICAL DATA:  Respiratory failure. EXAM: PORTABLE CHEST 1 VIEW COMPARISON:  12/04/2017. FINDINGS: Endotracheal tube in stable position. Cardiomegaly with pulmonary vascular prominence and bilateral interstitial prominence with bilateral pleural effusions. Findings consistent with CHF. Questionable nodular opacity right upper lobe. Follow-up PA lateral chest x-ray is suggested with the patient's clinical capable. IMPRESSION: 1.  Lines and tubes stable position. 2. Changes consistent with congestive heart failure bilateral from interstitial edema bilateral pleural effusions noted on today's exam. 3. Questionable nodular opacity right upper lobe. Follow-up PA lateral chest x-ray suggested for further evaluation when the patient is clinically capable. Electronically Signed   By: Marcello Moores  Register   On: 12/05/2017 09:10   Dg Chest Port 1 View  Result Date: 12/04/2017  CLINICAL DATA:  Hypoxia EXAM: PORTABLE CHEST 1 VIEW COMPARISON:  December 03, 2017 FINDINGS: Endotracheal tube tip is 3.8 cm above the carina. Nasogastric tube tip and side port  are below the diaphragm. There is no evident pneumothorax. There is a right pleural effusion with atelectasis in the right base. The lungs elsewhere are clear. Heart is mildly enlarged with pulmonary vascularity within normal limits. There is aortic atherosclerosis. No adenopathy. No bone lesions. IMPRESSION: Tube positions as described without pneumothorax. Right pleural effusion with right base atelectasis. Lungs elsewhere clear. Stable cardiac prominence. There is aortic atherosclerosis. Aortic Atherosclerosis (ICD10-I70.0). Electronically Signed   By: Lowella Grip III M.D.   On: 12/04/2017 07:29    EKG:   Orders placed or performed during the hospital encounter of 12/01/2017  . EKG 12-Lead  . EKG 12-Lead  . EKG 12-Lead  . EKG 12-Lead  . EKG 12-Lead  . EKG 12-Lead    ASSESSMENT AND PLAN:   67 year old male with past medical history significant for atrial fibrillation, COPD, congestive heart failure, CAD, GERD, hypertension and sleep apnea presents to hospital after cardiac arrest and hypoxic respiratory failure.   1. Cardiac arrest- PEA arrest from hypoxia -was on hypothermia protocol, started rewarming - since no improvement in neurological status- CT head today  2. Acute hypoxic respiratory failure - due to acute COPD exacerbation - mgmt per pulmonary -on vent, steroids and nebs- steroids discontinued by ICU team  3. Chronic afib- with rapid rvr- on amiodarone - changed to oral -appreciate cards consult  4. Sepsis- ? pneumonia - off pressors now, wbc elevated - on cefepime for now  5. ARF- ATN from sepsis, cardiac arrest - Decreased urine output and worsening creatinine.  6. DVT Prophylaxis- SQ heparin  Discussed with ICU attending Overall poor prognosis    All the records are reviewed and case discussed with Care Management/Social Workerr. Management plans discussed with the patient, family and they are in agreement.  CODE STATUS: DNR  TOTAL TIME TAKING CARE  OF THIS PATIENT: 36 minutes.   POSSIBLE D/C IN ?  DAYS, DEPENDING ON CLINICAL CONDITION.   Gladstone Lighter M.D on 12/05/2017 at 9:17 AM  Between 7am to 6pm - Pager - [phone]  After 6pm go to [email or web address] - password [email or web address] [facility]  Office  [phone]  CC: Primary care physician; Alanson Aly, [place]

## 2017-12-05 NOTE — Progress Notes (Addendum)
ETCO2 was  increasing and consistently in low 60's. RT called and vent rate increased  from 15 to 18.  That did not improve ETCO2 after 10 min. Shirlee More NP made aware. Some  vent asynchrony noted. Versed 4mg  IV was given. ETCO2 improving within 31min after versed.

## 2017-12-05 NOTE — Progress Notes (Signed)
RT called to room for pt desat in high 80's. Upon entering Rt noticed End tidal 79-83,  Vt in low 300's. RT cleared water in tubing and suction pt for sm amt thick tan secretions, increased RR from 15 to 20 and PC from 18 to 20. Sat maintained mid 90-s Vt high 300's and end tidal decreasing to low 60's. MD & RN aware of changes. Will continue to monitor

## 2017-12-05 NOTE — Progress Notes (Addendum)
PULMONARY / CRITICAL CARE MEDICINE   Name: Chad Hess MRN: [id number] DOB: 05/05/1951    ADMISSION DATE:  12/05/2017  PT PROFILE:   60 M smoker with O2 dependent COPD, CAF suffered OOH cardiac arrest with 27 mins ACLS by EMS after experiencing worsening dyspnea over 1-2 days prior.  Initial rhythm was PEA.  Intubated in field. TTM (36 C) protocol initiated in ED. Levetiracetam initiated in ED for myoclonic activity  MAJOR EVENTS/TEST RESULTS: 01/14 as above. Target T reached @ 0100 on 01/15 01/14 CT head: No definite CT evidence for acute intracranial abnormality 01/15 Cardiology consultation: PEA arrest felt to be due to respiratory failure and not a primary cardiac event. 01/15 Unresponsive on propofol infusion with frequent myoclonus. Levetiracetam changed to VPA 01/15 EEG: abnormal EEG due to a burst suppression activity 01/15 Echocardiogram: LVEF 55-65%.  Mild to moderate MR.  LA severely dilated.  Moderate to severe pulmonary hypertension (RVSP estimated 45-55 mmHg) 01/15 Pt made DNR in event of recurrent cardiac arrest 01/16 Rewarmed. Remains severely encephalopathic. Fentanyl infusion required for vent dyssynchrony 01/17 worsening renal function. Family conference re: guarded prognosis. Agreed that we will not initiate HD 01/17 CT head: no acute findings 01/17 CAF with tachycardia. Adding scheduled metoprolol  01/17 Neurology consultation. MRI, repeat EEG ordered   INDWELLING DEVICES:: ETT 01/14 >>  R femoral CVL 01/15 >>   MICRO DATA: MRSA PCR 01/14 >> NEG Urine 01/14 >> NEG Resp 01/14 >>  Blood 01/15 >>   PCT 01/15: 2.09, 3.01, 3.04  ANTIMICROBIALS:   Cefepime 01/15 >>     SUBJECTIVE:  Intubated, sedated on fentanyl. Myoclonus resolved.  Unresponsive.  VITAL SIGNS: BP (!) 154/75   Pulse (!) 108   Temp 98.6 F (37 C)   Resp 18   Ht 5\' 11"  (1.803 m)   Wt 108.9 kg (240 lb)   SpO2 93%   BMI 33.47 kg/m   HEMODYNAMICS: CVP:  [9 mmHg-26 mmHg] 20  mmHg  VENTILATOR SETTINGS: Vent Mode: PCV FiO2 (%):  [24 %-40 %] 40 % Set Rate:  [15 bmp-20 bmp] 15 bmp PEEP:  [5 cmH20] 5 cmH20 Plateau Pressure:  [17 cmH20] 17 cmH20  INTAKE / OUTPUT: I/O last 3 completed shifts: In: 4953.1 [I.V.:3892.4; NG/GT:750.7; IV Piggyback:310] Out: 1090 [Urine:1040; Emesis/NG output:50]  PHYSICAL EXAMINATION: General: Intubated, sedated Neuro: Unresponsive, pupils pinpoint, no spontaneous movement HEENT: NCAT, sclerae white Cardiovascular: IRIR, intermittently tachy, no M noted Lungs: Distant BS, coarse BS, prolonged exp wheezes Abdomen: Soft, diminished BS, no palpable masses Extremities: Cool, no edema Skin: No lesions noted  LABS:  BMET Recent Labs  Lab 12/04/17 0001 12/04/17 0356 12/05/17 0425  NA 138 138 138  K 4.1 4.1 5.0  CL 106 106 104  CO2 20* 19* 25  BUN 33* 34* 40*  CREATININE 1.77* 1.86* 2.33*  GLUCOSE 132* 119* 141*    Electrolytes Recent Labs  Lab 12/03/17 0057 12/03/17 0522  12/04/17 0001 12/04/17 0356 12/05/17 0425  CALCIUM 8.0* 8.0*   < > 8.2* 8.2* 8.0*  MG 2.2 2.1  --   --   --   --   PHOS  --  3.0  --   --   --   --    < > = values in this interval not displayed.    CBC Recent Labs  Lab 12/03/17 0522 12/04/17 0356 12/05/17 0425  WBC 24.1* 22.2* 19.0*  HGB 13.7 12.5* 12.6*  HCT 40.7 37.5* 38.1*  PLT 156 123* 120*  Coag's Recent Labs  Lab 12/03/17 0057 12/03/17 2009  APTT 30 34  INR 1.11 1.13    Sepsis Markers Recent Labs  Lab 11/20/2017 1750 12/03/17 0057 12/03/17 0522 12/04/17 0356  LATICACIDVEN 6.5* 1.4  --   --   PROCALCITON  --  2.09 3.01 3.04    ABG Recent Labs  Lab 12/10/2017 2004 12/03/17 0500  PHART 7.22* 7.27*  PCO2ART 60* 53*  PO2ART 119* 146*    Liver Enzymes Recent Labs  Lab 12/15/2017 1750 12/04/17 0356 12/05/17 0425  AST 76* 62* 57*  ALT 47 34 28  ALKPHOS 90 58 59  BILITOT 0.9 0.7 0.7  ALBUMIN 3.6 2.9* 2.8*    Cardiac Enzymes Recent Labs  Lab  12/03/17 0057 12/03/17 0522 12/04/17 0356  TROPONINI 0.46* 0.37* 0.13*    Glucose Recent Labs  Lab 12/04/17 1650 12/04/17 2003 12/04/17 2347 12/05/17 0345 12/05/17 0742 12/05/17 1145  GLUCAP 106* 129* 122* 125* 110* 112*    CXR: Bibasilar atx    ASSESSMENT / PLAN:  PULMONARY A: Smoker Severe baseline COPD Acute COPD exacerbation Acute on chronic respiratory  S/P respiratory arrest P:   Cont vent support - settings reviewed and/or adjusted Cont vent bundle Daily SBT if/when meets criteria  Cont nebulized steroids and BDs  CARDIOVASCULAR A:  Chronic atrial fibrillation Cardiac arrest P:  Monitor BP and rhythm Continue amiodarone load - changed to enteral 01/16 DNR in event of recurrent arrest  Scheduled metoprolol for rate control  RENAL A:   CKD AKI with oliguria P:   Monitor BMET intermittently Monitor I/Os Correct electrolytes as indicated   GASTROINTESTINAL A:   No acute issues P:   SUP: enteral famotidine Cont TF protocol 01/16  HEMATOLOGIC A:   Mild thrombocytopenia P:  DVT px: SQ heparin Monitor CBC intermittently Transfuse per usual guidelines   INFECTIOUS A:   Elevated PCT  Possible HCAP P:   Monitor temp, WBC count Micro and abx as above   ENDOCRINE A:   Hyperglycemia, stress-induced - resolved P:   Continue SSI  NEUROLOGIC A:   Anoxic encephalopathy - appears to be severe Myoclonus, resolved P:   RASS goal: -1, -2 Cont TTM protocol Cont fentanyl infusion as needed to maintain vent synchrony DC valproic acid 01/17 MRI, repeat EEG ordered  FAMILY update: Daughters and wife updated in detail.  Guarded prognosis re-iterated. Pt made DNR in event of recurrent cardiac arrest  CCM time: 35 mins The above time includes time spent in consultation with patient and/or family members and reviewing care plan on multidisciplinary rounds  Merton Border, MD PCCM service Mobile [phone] Pager [phone] 12/05/2017  2:48 PM

## 2017-12-05 NOTE — Progress Notes (Signed)
MRI completed. While pt moving from CT table to stretcher to bed, has body trembling that appears to be anxiety and agitation but could be possible seizure activity. No facial or eye twitching noted. He is tremulous but calming words and reassurance seem to decrease trembling extremities. Versed 4 mg iv given with immediate relief of episode.

## 2017-12-05 NOTE — Consult Note (Signed)
Reason for Consult:cardiac arrest  Referring Physician: Dr. Tressia Miners   CC: cardiac arrest   HPI: Chad Hess is an 67 y.o. male with past medical history significant for atrial fibrillation, COPD, congestive heart failure, CAD, GERD, hypertension and sleep apnea presents to hospital after cardiac arrest and hypoxic respiratory failure with suspected prolonged down time.      Past Medical History:  Diagnosis Date  . Atrial fibrillation ([place])   . CHF (congestive heart failure) ([place])   . Colon polyp   . COPD (chronic obstructive pulmonary disease) ([place])   . Coronary artery disease   . Diverticulitis   . ED (erectile dysfunction)   . GERD (gastroesophageal reflux disease)   . Hyperlipidemia   . Hypertension   . PUD (peptic ulcer disease)   . Pulmonary hypertension ([place])   . Sleep apnea    noncompliant with CPAP    Past Surgical History:  Procedure Laterality Date  . COLONOSCOPY  12/07/2004  . COLONOSCOPY N/A 04/03/2016   Procedure: COLONOSCOPY;  Surgeon: Lollie Sails, MD;  Location: [facility] ENDOSCOPY;  Service: Endoscopy;  Laterality: N/A;  . COLONOSCOPY WITH PROPOFOL N/A 02/20/2016   Procedure: COLONOSCOPY WITH PROPOFOL;  Surgeon: Lollie Sails, MD;  Location: [facility] ENDOSCOPY;  Service: Endoscopy;  Laterality: N/A;  . COLONOSCOPY WITH PROPOFOL N/A 04/02/2016   Procedure: COLONOSCOPY WITH PROPOFOL;  Surgeon: Lollie Sails, MD;  Location: [facility] ENDOSCOPY;  Service: Endoscopy;  Laterality: N/A;  . HERNIA REPAIR Right     Family History  Problem Relation Age of Onset  . Sudden death Mother   . Epilepsy Mother     Social History:  reports that he has been smoking.  He has a 50.00 pack-year smoking history. he has never used smokeless tobacco. He reports that he drinks alcohol. He reports that he does not use drugs.  Allergies  Allergen Reactions  . Chantix [Varenicline]     Medications: I have reviewed the patient's current medications.  ROS: Unable to obtain    Physical Examination: Blood pressure 121/67, pulse (!) 103, temperature 98.8 F (37.1 C), temperature source Rectal, resp. rate 13, height 5\' 11"  (1.803 m), weight 240 lb (108.9 kg), SpO2 100 %.  Was overbreathing the ventilator and had corneal's along with pupils Now s/p paralytics and difficult to obtain exam.    Laboratory Studies:   Basic Metabolic Panel: Recent Labs  Lab 12/03/17 0057 12/03/17 0522 12/03/17 0828 12/03/17 2009 12/04/17 0001 12/04/17 0356 12/05/17 0425  NA 139 140 142 139 138 138 138  K 6.4* 4.2 4.3 4.1 4.1 4.1 5.0  CL 105 106 105 107 106 106 104  CO2 25 25 25  20* 20* 19* 25  GLUCOSE 206* 236* 166* 160* 132* 119* 141*  BUN 26* 28* 30* 33* 33* 34* 40*  CREATININE 1.69* 1.70* 1.80* 1.85* 1.77* 1.86* 2.33*  CALCIUM 8.0* 8.0* 8.5* 8.2* 8.2* 8.2* 8.0*  MG 2.2 2.1  --   --   --   --   --   PHOS  --  3.0  --   --   --   --   --     Liver Function Tests: Recent Labs  Lab 12/08/2017 1750 12/04/17 0356 12/05/17 0425  AST 76* 62* 57*  ALT 47 34 28  ALKPHOS 90 58 59  BILITOT 0.9 0.7 0.7  PROT 6.2* 5.4* 5.5*  ALBUMIN 3.6 2.9* 2.8*   No results for input(s): LIPASE, AMYLASE in the last 168 hours. No results for input(s):  AMMONIA in the last 168 hours.  CBC: Recent Labs  Lab 12/05/2017 1750 12/03/17 0522 12/04/17 0356 12/05/17 0425  WBC 19.8* 24.1* 22.2* 19.0*  NEUTROABS 12.1*  --   --   --   HGB 14.6 13.7 12.5* 12.6*  HCT 44.6 40.7 37.5* 38.1*  MCV 100.8* 98.7 97.6 99.2  PLT 179 156 123* 120*    Cardiac Enzymes: Recent Labs  Lab 12/09/2017 1750 12/03/17 0057 12/03/17 0522 12/04/17 0356  TROPONINI 0.05* 0.46* 0.37* 0.13*    BNP: Invalid input(s): POCBNP  CBG: Recent Labs  Lab 12/04/17 1650 12/04/17 2003 12/04/17 2347 12/05/17 0345 12/05/17 0742  GLUCAP 106* 129* 122* 125* 110*    Microbiology: Results for orders placed or performed during the hospital encounter of 12/01/2017  Culture, respiratory (NON-Expectorated)     Status:  None (Preliminary result)   Collection Time: 12/04/2017  4:43 AM  Result Value Ref Range Status   Specimen Description   Final    TRACHEAL ASPIRATE Performed at [facility], [address] [place], [place] [address]    Special Requests   Final    NONE Performed at [facility], [address] [place], [place] [address]    Gram Stain   Final    ABUNDANT WBC PRESENT, PREDOMINANTLY PMN ABUNDANT GRAM POSITIVE COCCI FEW GRAM NEGATIVE RODS RARE YEAST    Culture   Final    CULTURE REINCUBATED FOR BETTER GROWTH Performed at [facility], [place] [address] [place], [place] [address]    Report Status PENDING  Incomplete  Urine Culture     Status: None   Collection Time: 11/30/2017  5:48 PM  Result Value Ref Range Status   Specimen Description   Final    URINE, RANDOM Performed at [facility], [address], [place], [place] [address]    Special Requests   Final    NONE Performed at [facility], [address] [place], [place] [address]    Culture   Final    NO GROWTH Performed at [facility], [place] [address], [place], [place] [address]    Report Status 12/04/2017 FINAL  Final  MRSA PCR Screening     Status: None   Collection Time: 11/19/2017 10:35 PM  Result Value Ref Range Status   MRSA by PCR NEGATIVE NEGATIVE Final    Comment:        The GeneXpert MRSA Assay (FDA approved for NASAL specimens only), is one component of a comprehensive MRSA colonization surveillance program. It is not intended to diagnose MRSA infection nor to guide or monitor treatment for MRSA infections. Performed at [facility], [address] [place], [place] [address]   CULTURE, BLOOD (ROUTINE X 2) w Reflex to ID Panel     Status: None (Preliminary result)   Collection Time: 12/03/17  4:08 AM  Result Value Ref Range Status   Specimen Description BLOOD RT FOREARM  Final   Special Requests   Final    BOTTLES DRAWN AEROBIC AND  ANAEROBIC Blood Culture results may not be optimal due to an inadequate volume of blood received in culture bottles   Culture   Final    NO GROWTH 2 DAYS Performed at [facility], [address] [place], [place] [address]    Report Status PENDING  Incomplete  CULTURE, BLOOD (ROUTINE X 2) w Reflex to ID Panel     Status: None (Preliminary result)   Collection Time: 12/03/17  4:13  AM  Result Value Ref Range Status   Specimen Description BLOOD RT HAND  Final   Special Requests   Final    BOTTLES DRAWN AEROBIC AND ANAEROBIC Blood Culture results may not be optimal due to an inadequate volume of blood received in culture bottles   Culture   Final    NO GROWTH 2 DAYS Performed at [facility], [address] [place], [place] [address]    Report Status PENDING  Incomplete    Coagulation Studies: Recent Labs    12/03/17 0057 12/03/17 2009  LABPROT 14.2 14.4  INR 1.11 1.13    Urinalysis: No results for input(s): COLORURINE, LABSPEC, PHURINE, GLUCOSEU, HGBUR, BILIRUBINUR, KETONESUR, PROTEINUR, UROBILINOGEN, NITRITE, LEUKOCYTESUR in the last 168 hours.  Invalid input(s): APPERANCEUR  Lipid Panel:     Component Value Date/Time   TRIG 87 12/03/2017 0057    HgbA1C:  Lab Results  Component Value Date   HGBA1C 5.6 10/23/2017    Urine Drug Screen:      Component Value Date/Time   LABOPIA NONE DETECTED 07/26/2017 2154   COCAINSCRNUR NONE DETECTED 07/26/2017 2154   LABBENZ NONE DETECTED 07/26/2017 2154   AMPHETMU NONE DETECTED 07/26/2017 2154   THCU NONE DETECTED 07/26/2017 2154   LABBARB NONE DETECTED 07/26/2017 2154    Alcohol Level: No results for input(s): ETH in the last 168 hours.   Imaging: Ct Head Wo Contrast  Result Date: 12/05/2017 CLINICAL DATA:  Altered mental status.  Recent cardiac arrest EXAM: CT HEAD WITHOUT CONTRAST TECHNIQUE: Contiguous axial images were obtained from the base of the skull through the vertex without intravenous  contrast. COMPARISON:  December 02, 2017 FINDINGS: Brain: Ventricles appear normal in size and configuration. There is equivocal mild frontal atrophy bilaterally, stable. There is no appreciable intracranial mass, hemorrhage, extra-axial fluid collection, or midline shift. No focal gray-white compartment lesions are demonstrable on this study. No acute infarct is demonstrated. Vascular: No hyperdense vessel. There is calcification in each carotid siphon region. Skull: Bony calvarium appears intact. Sinuses/Orbits: Air-fluid levels are noted in each sphenoid sinus. There is mucosal thickening in several ethmoid air cells with an air-fluid level in the left anterior ethmoid air cell region. Orbits appear symmetric bilaterally. Other: Mastoids on the left are clear. There is opacification of several mastoid air cells on the right. There is debris in each external auditory canal. IMPRESSION: 1. Stable appearance compared to recent study. No focal gray-white compartment lesions. No mass or hemorrhage. No extra-axial fluid collection. 2.  Foci of arterial vascular calcification. 3. Multifocal paranasal sinus disease, most notably in the sphenoid sinuses bilaterally. Mastoid disease on the right noted. Probable cerumen in each external auditory canal. Electronically Signed   By: Lowella Grip III M.D.   On: 12/05/2017 09:51   Dg Chest Port 1 View  Result Date: 12/05/2017 CLINICAL DATA:  Respiratory failure. EXAM: PORTABLE CHEST 1 VIEW COMPARISON:  12/04/2017. FINDINGS: Endotracheal tube in stable position. Cardiomegaly with pulmonary vascular prominence and bilateral interstitial prominence with bilateral pleural effusions. Findings consistent with CHF. Questionable nodular opacity right upper lobe. Follow-up PA lateral chest x-ray is suggested with the patient's clinical capable. IMPRESSION: 1.  Lines and tubes stable position. 2. Changes consistent with congestive heart failure bilateral from interstitial edema  bilateral pleural effusions noted on today's exam. 3. Questionable nodular opacity right upper lobe. Follow-up PA lateral chest x-ray suggested for further evaluation when the patient is clinically capable. Electronically Signed   By: Marcello Moores  Register   On:  12/05/2017 09:10   Dg Chest Port 1 View  Result Date: 12/04/2017 CLINICAL DATA:  Hypoxia EXAM: PORTABLE CHEST 1 VIEW COMPARISON:  December 03, 2017 FINDINGS: Endotracheal tube tip is 3.8 cm above the carina. Nasogastric tube tip and side port are below the diaphragm. There is no evident pneumothorax. There is a right pleural effusion with atelectasis in the right base. The lungs elsewhere are clear. Heart is mildly enlarged with pulmonary vascularity within normal limits. There is aortic atherosclerosis. No adenopathy. No bone lesions. IMPRESSION: Tube positions as described without pneumothorax. Right pleural effusion with right base atelectasis. Lungs elsewhere clear. Stable cardiac prominence. There is aortic atherosclerosis. Aortic Atherosclerosis (ICD10-I70.0). Electronically Signed   By: Lowella Grip III M.D.   On: 12/04/2017 07:29     Assessment/Plan: 67 y.o. male with past medical history significant for atrial fibrillation, COPD, congestive heart failure, CAD, GERD, hypertension and sleep apnea presents to hospital after cardiac arrest and hypoxic respiratory failure with suspected prolonged down time.    - CTH x 2 no acute abnormalities - MRI brain non contrast - EEG ordered for today - d/w family current condition and guarded prognosis as well as reason for MRI.  - will follow imaging and have further discussions  Leotis Pain  12/05/2017, 11:25 AM

## 2017-12-05 NOTE — Progress Notes (Signed)
Dr Irish Elders returns page. Possible seizure activity vs anxiety/agitation noted at 1515 was described.  He was made aware depakote was Dc'd today. He reviewed MRI and will speak with family in person tomorrow. No new orders.

## 2017-12-06 ENCOUNTER — Inpatient Hospital Stay: Payer: Medicare HMO

## 2017-12-06 DIAGNOSIS — J9601 Acute respiratory failure with hypoxia: Secondary | ICD-10-CM

## 2017-12-06 LAB — BASIC METABOLIC PANEL
Anion gap: 7 (ref 5–15)
BUN: 49 mg/dL — ABNORMAL HIGH (ref 6–20)
CALCIUM: 7.4 mg/dL — AB (ref 8.9–10.3)
CO2: 21 mmol/L — ABNORMAL LOW (ref 22–32)
CREATININE: 2.19 mg/dL — AB (ref 0.61–1.24)
Chloride: 112 mmol/L — ABNORMAL HIGH (ref 101–111)
GFR, EST AFRICAN AMERICAN: 34 mL/min — AB (ref >60)
GFR, EST NON AFRICAN AMERICAN: 30 mL/min — AB (ref >60)
Glucose, Bld: 160 mg/dL — ABNORMAL HIGH (ref 65–99)
Potassium: 4.3 mmol/L (ref 3.5–5.1)
SODIUM: 140 mmol/L (ref 135–145)

## 2017-12-06 LAB — CULTURE, RESPIRATORY: CULTURE: NORMAL

## 2017-12-06 LAB — GLUCOSE, CAPILLARY
GLUCOSE-CAPILLARY: 135 mg/dL — AB (ref 65–99)
Glucose-Capillary: 167 mg/dL — ABNORMAL HIGH (ref 65–99)
Glucose-Capillary: 148 mg/dL — ABNORMAL HIGH (ref 65–99)

## 2017-12-06 LAB — CULTURE, RESPIRATORY W GRAM STAIN

## 2017-12-06 MED ORDER — VALPROATE SODIUM 500 MG/5ML IV SOLN
500.0000 mg | Freq: Three times a day (TID) | INTRAVENOUS | Status: DC
  Filled 2017-12-06 (×4): qty 5

## 2017-12-06 MED ORDER — VALPROATE SODIUM 500 MG/5ML IV SOLN
500.0000 mg | INTRAVENOUS | Status: AC
  Administered 2017-12-06: 500 mg via INTRAVENOUS
  Filled 2017-12-06: qty 5

## 2017-12-06 MED ORDER — FENTANYL CITRATE (PF) 100 MCG/2ML IJ SOLN
25.0000 ug | INTRAMUSCULAR | Status: DC | PRN
  Administered 2017-12-06: 25 ug via INTRAVENOUS
  Filled 2017-12-06: qty 2

## 2017-12-08 LAB — CULTURE, BLOOD (ROUTINE X 2)
CULTURE: NO GROWTH
Culture: NO GROWTH

## 2017-12-10 ENCOUNTER — Telehealth: Payer: Self-pay | Admitting: *Deleted

## 2017-12-10 NOTE — Telephone Encounter (Signed)
Death Certificate received from [place] and placed in provider folder.

## 2017-12-10 NOTE — Telephone Encounter (Signed)
Informed funeral home death cert is ready for pick up. 

## 2017-12-20 NOTE — Progress Notes (Signed)
PULMONARY / CRITICAL CARE MEDICINE   Name: Chad Hess MRN: [id number] DOB: [birth year]    ADMISSION DATE:  12/08/2017  PT PROFILE:   36 M smoker with O2 dependent COPD, CAF suffered OOH cardiac arrest with 27 mins ACLS by EMS after experiencing worsening dyspnea over 1-2 days prior.  Initial rhythm was PEA.  Intubated in field. TTM (36 C) protocol initiated in ED. Levetiracetam initiated in ED for myoclonic activity  MAJOR EVENTS/TEST RESULTS: 01/14 as above. Target T reached @ 0100 on 01/15 01/14 CT head: No definite CT evidence for acute intracranial abnormality 01/15 Cardiology consultation: PEA arrest felt to be due to respiratory failure and not a primary cardiac event. 01/15 Unresponsive on propofol infusion with frequent myoclonus. Levetiracetam changed to VPA 01/15 EEG: abnormal EEG due to a burst suppression activity 01/15 Echocardiogram: LVEF 55-65%.  Mild to moderate MR.  LA severely dilated.  Moderate to severe pulmonary hypertension (RVSP estimated 45-55 mmHg) 01/15 Pt made DNR in event of recurrent cardiac arrest 01/16 Rewarmed. Remains severely encephalopathic. Fentanyl infusion required for vent dyssynchrony 01/17 worsening renal function. Family conference re: guarded prognosis. Agreed that we will not initiate HD 01/17 CT head: no acute findings 01/17 CAF with tachycardia. Adding scheduled metoprolol  01/17 Neurology consultation. MRI ordered 01/17 MRI brain: Cerebrum and cerebellum abnormal signal/edema in a pattern most compatible with hypoxic ischemic encephalopathy 01/18 [facility]aily has decided to proceed with terminal extubation. Withdrawal protocol implemented   INDWELLING DEVICES:: ETT 01/14 >>  R femoral CVL 01/15 >>   MICRO DATA: MRSA PCR 01/14 >> NEG Urine 01/14 >> NEG Resp 01/14 >> NOF Blood 01/15 >> NEG  PCT 01/15: 2.09, 3.01, 3.04  ANTIMICROBIALS:   Cefepime 01/15 >>     SUBJECTIVE:  Intubated, sedated on fentanyl. Myoclonus resolved.   Unresponsive.  VITAL SIGNS: BP 125/64   Pulse (!) 104   Temp 99.5 F (37.5 C)   Resp 20   Ht 5\' 11"  (1.803 m)   Wt 108.9 kg (240 lb)   SpO2 91%   BMI 33.47 kg/m   HEMODYNAMICS: CVP:  [0 mmHg-24 mmHg] 14 mmHg  VENTILATOR SETTINGS: Vent Mode: PCV FiO2 (%):  [40 %] 40 % Set Rate:  [15 bmp-18 bmp] 18 bmp PEEP:  [5 cmH20] 5 cmH20  INTAKE / OUTPUT: I/O last 3 completed shifts: In: 2775 [P.O.:40; I.V.:1175; NG/GT:1360; IV Piggyback:200] Out: 995 [Urine:975; Stool:20]  PHYSICAL EXAMINATION: General: Intubated, sedated Neuro: Unresponsive, pupils pinpoint, no spontaneous movement HEENT: NCAT, sclerae white Cardiovascular: IRIR, intermittently tachy, no M noted Lungs: Distant BS, coarse BS, prolonged exp wheezes Abdomen: Soft, diminished BS, no palpable masses Extremities: Cool, no edema Skin: No lesions noted  LABS:  BMET Recent Labs  Lab 12/04/17 0356 12/05/17 0425 24-Dec-2017 0527  NA 138 138 140  K 4.1 5.0 4.3  CL 106 104 112*  CO2 19* 25 21*  BUN 34* 40* 49*  CREATININE 1.86* 2.33* 2.19*  GLUCOSE 119* 141* 160*    Electrolytes Recent Labs  Lab 12/03/17 0057 12/03/17 0522  12/04/17 0356 12/05/17 0425 2017/12/24 0527  CALCIUM 8.0* 8.0*   < > 8.2* 8.0* 7.4*  MG 2.2 2.1  --   --   --   --   PHOS  --  3.0  --   --   --   --    < > = values in this interval not displayed.    CBC Recent Labs  Lab 12/03/17 0522 12/04/17 0356 12/05/17 0425  WBC 24.1*  22.2* 19.0*  HGB 13.7 12.5* 12.6*  HCT 40.7 37.5* 38.1*  PLT 156 123* 120*    Coag's Recent Labs  Lab 12/03/17 0057 12/03/17 2009  APTT 30 34  INR 1.11 1.13    Sepsis Markers Recent Labs  Lab 12/12/2017 1750 12/03/17 0057 12/03/17 0522 12/04/17 0356  LATICACIDVEN 6.5* 1.4  --   --   PROCALCITON  --  2.09 3.01 3.04    ABG Recent Labs  Lab 12/19/2017 2004 12/03/17 0500  PHART 7.22* 7.27*  PCO2ART 60* 53*  PO2ART 119* 146*    Liver Enzymes Recent Labs  Lab 12/01/2017 1750  12/04/17 0356 12/05/17 0425  AST 76* 62* 57*  ALT 47 34 28  ALKPHOS 90 58 59  BILITOT 0.9 0.7 0.7  ALBUMIN 3.6 2.9* 2.8*    Cardiac Enzymes Recent Labs  Lab 12/03/17 0057 12/03/17 0522 12/04/17 0356  TROPONINI 0.46* 0.37* 0.13*    Glucose Recent Labs  Lab 12/05/17 1145 12/05/17 1624 12/05/17 1945 2017-12-16 0003 16-Dec-2017 0357 12/16/17 0745  GLUCAP 112* 156* 121* 135* 167* 148*    CXR: Bibasilar atx    ASSESSMENT / PLAN: Smoker Severe baseline COPD Acute COPD exacerbation Acute on chronic respiratory  S/P respiratory arrest Chronic atrial fibrillation Cardiac arrest CKD AKI with oliguria Mild thrombocytopenia Elevated PCT  Possible HCAP Hyperglycemia, stress-induced - resolved Severe anoxic encephalopathy  Myoclonus P:   Discussed with Dr Irish Elders Discussed with family Withdrawal of life-sustaining therapies protocol implemented   Merton Border, MD PCCM service Mobile [phone] Pager [phone] 12-16-2017 11:40 AM

## 2017-12-20 NOTE — Consult Note (Signed)
S/p MRI yesterday and consistent with anoxia.  Past Medical History:  Diagnosis Date  . Atrial fibrillation ([place])   . CHF (congestive heart failure) ([place])   . Colon polyp   . COPD (chronic obstructive pulmonary disease) ([place])   . Coronary artery disease   . Diverticulitis   . ED (erectile dysfunction)   . GERD (gastroesophageal reflux disease)   . Hyperlipidemia   . Hypertension   . PUD (peptic ulcer disease)   . Pulmonary hypertension ([place])   . Sleep apnea    noncompliant with CPAP    Past Surgical History:  Procedure Laterality Date  . COLONOSCOPY  12/07/2004  . COLONOSCOPY N/A 04/03/2016   Procedure: COLONOSCOPY;  Surgeon: Lollie Sails, MD;  Location: [facility] ENDOSCOPY;  Service: Endoscopy;  Laterality: N/A;  . COLONOSCOPY WITH PROPOFOL N/A 02/20/2016   Procedure: COLONOSCOPY WITH PROPOFOL;  Surgeon: Lollie Sails, MD;  Location: [facility] ENDOSCOPY;  Service: Endoscopy;  Laterality: N/A;  . COLONOSCOPY WITH PROPOFOL N/A 04/02/2016   Procedure: COLONOSCOPY WITH PROPOFOL;  Surgeon: Lollie Sails, MD;  Location: [facility] ENDOSCOPY;  Service: Endoscopy;  Laterality: N/A;  . HERNIA REPAIR Right     Family History  Problem Relation Age of Onset  . Sudden death Mother   . Epilepsy Mother     Social History:  reports that he has been smoking.  He has a 50.00 pack-year smoking history. he has never used smokeless tobacco. He reports that he drinks alcohol. He reports that he does not use drugs.  Allergies  Allergen Reactions  . Chantix [Varenicline]     Medications: I have reviewed the patient's current medications.   Physical Examination: Blood pressure 125/64, pulse (!) 104, temperature 99.5 F (37.5 C), resp. rate 20, height 5\' 11"  (1.803 m), weight 240 lb (108.9 kg), SpO2 91 %.  Not following commands Has myoclonus Has pupils and corneals Overbreathing ventilator   Laboratory Studies:   Basic Metabolic Panel: Recent Labs  Lab 12/03/17 0057 12/03/17 0522   12/03/17 2009 12/04/17 0001 12/04/17 0356 12/05/17 0425 12-20-17 0527  NA 139 140   < > 139 138 138 138 140  K 6.4* 4.2   < > 4.1 4.1 4.1 5.0 4.3  CL 105 106   < > 107 106 106 104 112*  CO2 25 25   < > 20* 20* 19* 25 21*  GLUCOSE 206* 236*   < > 160* 132* 119* 141* 160*  BUN 26* 28*   < > 33* 33* 34* 40* 49*  CREATININE 1.69* 1.70*   < > 1.85* 1.77* 1.86* 2.33* 2.19*  CALCIUM 8.0* 8.0*   < > 8.2* 8.2* 8.2* 8.0* 7.4*  MG 2.2 2.1  --   --   --   --   --   --   PHOS  --  3.0  --   --   --   --   --   --    < > = values in this interval not displayed.    Liver Function Tests: Recent Labs  Lab 12/19/2017 1750 12/04/17 0356 12/05/17 0425  AST 76* 62* 57*  ALT 47 34 28  ALKPHOS 90 58 59  BILITOT 0.9 0.7 0.7  PROT 6.2* 5.4* 5.5*  ALBUMIN 3.6 2.9* 2.8*   No results for input(s): LIPASE, AMYLASE in the last 168 hours. No results for input(s): AMMONIA in the last 168 hours.  CBC: Recent Labs  Lab 12/15/2017 1750 12/03/17 0522 12/04/17 0356 12/05/17 0425  WBC 19.8* 24.1* 22.2* 19.0*  NEUTROABS 12.1*  --   --   --   HGB 14.6 13.7 12.5* 12.6*  HCT 44.6 40.7 37.5* 38.1*  MCV 100.8* 98.7 97.6 99.2  PLT 179 156 123* 120*    Cardiac Enzymes: Recent Labs  Lab 12/15/2017 1750 12/03/17 0057 12/03/17 0522 12/04/17 0356  TROPONINI 0.05* 0.46* 0.37* 0.13*    BNP: Invalid input(s): POCBNP  CBG: Recent Labs  Lab 12/05/17 1624 12/05/17 1945 Dec 24, 2017 0003 Dec 24, 2017 0357 12-24-17 0745  GLUCAP 156* 121* 135* 167* 148*    Microbiology: Results for orders placed or performed during the hospital encounter of 12/04/2017  Culture, respiratory (NON-Expectorated)     Status: None   Collection Time: 12/05/2017  4:43 AM  Result Value Ref Range Status   Specimen Description   Final    TRACHEAL ASPIRATE Performed at [facility], [address], [place], [place] [address]    Special Requests   Final    NONE Performed at [facility], [address]  [place], [place] [address]    Gram Stain   Final    ABUNDANT WBC PRESENT, PREDOMINANTLY PMN ABUNDANT GRAM POSITIVE COCCI FEW GRAM NEGATIVE RODS RARE YEAST    Culture   Final    Consistent with normal respiratory flora. Performed at [facility], [place] [address] [place], [place] [address]    Report Status 2017-12-24 FINAL  Final  Urine Culture     Status: None   Collection Time: 12/08/2017  5:48 PM  Result Value Ref Range Status   Specimen Description   Final    URINE, RANDOM Performed at [facility], [address] [place], [place] [address]    Special Requests   Final    NONE Performed at [facility], [address], [place], [place] [address]    Culture   Final    NO GROWTH Performed at [facility], [place] [address], [place], [place] [address]    Report Status 12/04/2017 FINAL  Final  MRSA PCR Screening     Status: None   Collection Time: 11/23/2017 10:35 PM  Result Value Ref Range Status   MRSA by PCR NEGATIVE NEGATIVE Final    Comment:        The GeneXpert MRSA Assay (FDA approved for NASAL specimens only), is one component of a comprehensive MRSA colonization surveillance program. It is not intended to diagnose MRSA infection nor to guide or monitor treatment for MRSA infections. Performed at [facility], [address] [place], [place] [address]   CULTURE, BLOOD (ROUTINE X 2) w Reflex to ID Panel     Status: None (Preliminary result)   Collection Time: 12/03/17  4:08 AM  Result Value Ref Range Status   Specimen Description BLOOD RT FOREARM  Final   Special Requests   Final    BOTTLES DRAWN AEROBIC AND ANAEROBIC Blood Culture results may not be optimal due to an inadequate volume of blood received in culture bottles   Culture   Final    NO GROWTH 3 DAYS Performed at [facility], [address] [place], [place] [address]    Report Status PENDING  Incomplete  CULTURE, BLOOD (ROUTINE X 2) w Reflex to ID Panel      Status: None (Preliminary result)   Collection Time: 12/03/17  4:13 AM  Result Value Ref Range Status   Specimen Description BLOOD RT HAND  Final   Special Requests  Final    BOTTLES DRAWN AEROBIC AND ANAEROBIC Blood Culture results may not be optimal due to an inadequate volume of blood received in culture bottles   Culture   Final    NO GROWTH 3 DAYS Performed at [facility], [address] [place], [place] [address]    Report Status PENDING  Incomplete    Coagulation Studies: Recent Labs    12/03/17 March 13, 2008  LABPROT 14.4  INR 1.13    Urinalysis: No results for input(s): COLORURINE, LABSPEC, PHURINE, GLUCOSEU, HGBUR, BILIRUBINUR, KETONESUR, PROTEINUR, UROBILINOGEN, NITRITE, LEUKOCYTESUR in the last 168 hours.  Invalid input(s): APPERANCEUR  Lipid Panel:     Component Value Date/Time   TRIG 87 12/03/2017 0057    HgbA1C:  Lab Results  Component Value Date   HGBA1C 5.6 10/23/2017    Urine Drug Screen:      Component Value Date/Time   LABOPIA NONE DETECTED 07/26/2017 13-Mar-2153   COCAINSCRNUR NONE DETECTED 07/26/2017 03/13/2153   LABBENZ NONE DETECTED 07/26/2017 2153/03/13   AMPHETMU NONE DETECTED 07/26/2017 03-13-2153   THCU NONE DETECTED 07/26/2017 03-13-2153   LABBARB NONE DETECTED 07/26/2017 03/13/53    Alcohol Level: No results for input(s): ETH in the last 168 hours.   Imaging: Ct Head Hess Contrast  Result Date: 12/05/2017 CLINICAL DATA:  Altered mental status.  Recent cardiac arrest EXAM: CT HEAD WITHOUT CONTRAST TECHNIQUE: Contiguous axial images were obtained from the base of the skull through the vertex without intravenous contrast. COMPARISON:  December 02, 2017 FINDINGS: Brain: Ventricles appear normal in size and configuration. There is equivocal mild frontal atrophy bilaterally, stable. There is no appreciable intracranial mass, hemorrhage, extra-axial fluid collection, or midline shift. No focal gray-white compartment lesions are demonstrable on this study. No acute infarct  is demonstrated. Vascular: No hyperdense vessel. There is calcification in each carotid siphon region. Skull: Bony calvarium appears intact. Sinuses/Orbits: Air-fluid levels are noted in each sphenoid sinus. There is mucosal thickening in several ethmoid air cells with an air-fluid level in the left anterior ethmoid air cell region. Orbits appear symmetric bilaterally. Other: Mastoids on the left are clear. There is opacification of several mastoid air cells on the right. There is debris in each external auditory canal. IMPRESSION: 1. Stable appearance compared to recent study. No focal gray-white compartment lesions. No mass or hemorrhage. No extra-axial fluid collection. 2.  Foci of arterial vascular calcification. 3. Multifocal paranasal sinus disease, most notably in the sphenoid sinuses bilaterally. Mastoid disease on the right noted. Probable cerumen in each external auditory canal. Electronically Signed   By: Lowella Grip III M.D.   On: 12/05/2017 09:51   Chad Hess Contrast  Result Date: 12/05/2017 CLINICAL DATA:  Cardiac arrest. History of hypertension, hyperlipidemia, atrial fibrillation and COPD. EXAM: MRI HEAD WITHOUT CONTRAST TECHNIQUE: Multiplanar, multiecho pulse sequences of the brain and surrounding structures were obtained without intravenous contrast. COMPARISON:  CT HEAD December 05, 2017 at 0835 hours FINDINGS: BRAIN: Limited coronal diffusion weighted imaging due to field of view. Greater than expected reduced diffusion bilateral basal ganglia and to lesser extent frontal cortex with T2 shine through and FLAIR T2 hyperintense signal. No susceptibility artifact to suggest hemorrhage. Ventricles and sulci are normal for patient's age. A few punctate supratentorial white matter FLAIR T2 hyperintensities compatible with chronic small vessel ischemic disease, less than expected for age. Mildly effaced posterior fossa extra-axial spaces inferring parenchymal cerebellar edema. No abnormal  extra-axial fluid collections. VASCULAR: Normal major intracranial vascular flow voids present at skull base.  SKULL AND UPPER CERVICAL SPINE: No abnormal sellar expansion. No suspicious calvarial bone marrow signal. LEFT frontal scalp susceptibility artifact, punctate metallic foreign body on today's head CT. Craniocervical junction maintained. SINUSES/ORBITS: LEFT ocular globe obscured by metallic susceptibility artifact. Normal RIGHT ocular globe and orbital contents. Mild to moderate paranasal sinusitis, RIGHT mastoid effusion. OTHER: None. IMPRESSION: 1. Cerebrum and cerebellum abnormal signal/edema in a pattern most compatible with hypoxic ischemic encephalopathy. 2. Otherwise negative noncontrast MRI of the head for age. 3. Critical Value/emergent results were called by telephone at the time of interpretation on 12/05/2017 at 3:25 pm to Dr. Mortimer Fries, Pulmonology , who verbally acknowledged these results. Electronically Signed   By: Elon Alas M.D.   On: 12/05/2017 15:49   Dg Chest Port 1 View  Result Date: 12-11-17 CLINICAL DATA:  Hypoxia EXAM: PORTABLE CHEST 1 VIEW COMPARISON:  December 05, 2017 FINDINGS: Endotracheal tube tip is 3.0 cm above the carina. No pneumothorax. There are small pleural effusions bilaterally with bibasilar atelectasis. Lungs elsewhere are clear. Heart is enlarged but stable. Pulmonary vascular is normal. No adenopathy. There is aortic atherosclerosis. IMPRESSION: Endotracheal tube as described without pneumothorax. Bibasilar atelectasis with pleural effusions, stable. No new opacity. Stable cardiomegaly. There is aortic atherosclerosis. Aortic Atherosclerosis (ICD10-I70.0). Electronically Signed   By: Lowella Grip III M.D.   On: 11-Dec-2017 07:05   Dg Chest Port 1 View  Result Date: 12/05/2017 CLINICAL DATA:  Respiratory failure. EXAM: PORTABLE CHEST 1 VIEW COMPARISON:  12/04/2017. FINDINGS: Endotracheal tube in stable position. Cardiomegaly with pulmonary vascular  prominence and bilateral interstitial prominence with bilateral pleural effusions. Findings consistent with CHF. Questionable nodular opacity right upper lobe. Follow-up PA lateral chest x-ray is suggested with the patient's clinical capable. IMPRESSION: 1.  Lines and tubes stable position. 2. Changes consistent with congestive heart failure bilateral from interstitial edema bilateral pleural effusions noted on today's exam. 3. Questionable nodular opacity right upper lobe. Follow-up PA lateral chest x-ray suggested for further evaluation when the patient is clinically capable. Electronically Signed   By: Marcello Moores  Register   On: 12/05/2017 09:10     Assessment/Plan: 67 y.o. male with past medical history significant for atrial fibrillation, COPD, congestive heart failure, CAD, GERD, hypertension and sleep apnea presents to hospital after cardiac arrest and hypoxic respiratory failure with suspected prolonged down time.    MRI brain done last night with diffuse anoxia S/p discussion with family and they understand very poor prognosis for any meaningful recovery Family agrees on withdrawal of care and waiting for rest of family.  Myoclonus, would not treat Don't think needs EEG at this time given the prognosis and plan.     Leotis Pain  12-11-17, 11:29 AM

## 2017-12-20 NOTE — Progress Notes (Signed)
0500 - Removed Artic Gel pads from patient per protocol.  Pt tolerated well.  Temperature currently monitored through foley temp probe.

## 2017-12-20 NOTE — Progress Notes (Signed)
Pt was suctioned prior to extubation for a small amount of secretions. Per Dr. Alva Garnet order, he was extubated. No stridor heard.

## 2017-12-20 NOTE — Discharge Summary (Signed)
DEATH SUMMARY  DATE OF ADMISSION:  12/03/2017  DATE OF DISCHARGE/DEATH: 07-Dec-2017   ADMISSION DIAGNOSES:   Cardiac arrest Seizure Acute resp failure COPD exacerbation AFRVR Hyperkalemia AKI Lactic acidosis Elevated trop I Chronic diastolic heart failure   DISCHARGE DIAGNOSES:   Prolonged out of hospital cardiac arrest Respiratory arrest Smoker Severe baseline COPD Acute COPD exacerbation Acute on chronic respiratory  Chronic atrial fibrillation CKD AKI with oliguria Mild thrombocytopenia Elevated PCT  Possible HCAP Hyperglycemia, stress-induced Severe anoxic encephalopathy  Myoclonus   PRESENTATION:   Pt was admitted with the following HPI and the above admission diagnoses:  Chad Hess  is a 67 y.o. male with a known history of multiple medical problems as documented.  The history is limited since the patient is intubated.  According to ED physician, the patient has worsening shortness of breath at home.  When the EMS arrived the patient was found cardiac arrest.  CPR was started by EMS.  The patient was given 3 doses of IV in the 1 dose of bicarb.  CPR was performed 27 minutes and the pulses were regained.  But the patient had a tonic-clonic jerking movement throughout his body.  He was given Versed and seizure stopped.  The EKG show A. fib with RVR at 108 in the ED.  CAT scan of the head is unremarkable.  Chest x-ray did not show any pulmonary edema or infiltrate.    HOSPITAL COURSE:   PT PROFILE:   41 M smoker with O2 dependent COPD, CAF suffered OOH cardiac arrest with 27 mins ACLS by EMS after experiencing worsening dyspnea over 1-2 days prior.  Initial rhythm was PEA.  Intubated in field. TTM (36 C) protocol initiated in ED. Levetiracetam initiated in ED for myoclonic activity  MAJOR EVENTS/TEST RESULTS: 12-03-2022 as above. Target T reached @ 0100 on 01/15 12-03-22 CT head: No definite CT evidence for acute intracranial abnormality 01/15 Cardiology consultation: PEA  arrest felt to be due to respiratory failure and not a primary cardiac event. 01/15 Unresponsive on propofol infusion with frequent myoclonus. Levetiracetam changed to VPA 01/15 EEG: abnormal EEG due to a burst suppression activity 01/15 Echocardiogram: LVEF 55-65%.  Mild to moderate MR.  LA severely dilated.  Moderate to severe pulmonary hypertension (RVSP estimated 45-55 mmHg) 01/15 Pt made DNR in event of recurrent cardiac arrest 01/16 Rewarmed. Remains severely encephalopathic. Fentanyl infusion required for vent dyssynchrony 01/17 worsening renal function. Family conference re: guarded prognosis. Agreed that we will not initiate HD 01/17 CT head: no acute findings 01/17 CAF with tachycardia. Adding scheduled metoprolol  01/17 Neurology consultation. MRI ordered 01/17 MRI brain: Cerebrum and cerebellum abnormal signal/edema in a pattern most compatible with hypoxic ischemic encephalopathy 12/07/2022 Family decided to proceed with terminal extubation. Withdrawal protocol implemented   Cause of death:  Anoxic encephalopathy, due to  prolonged cardiac arrest, due to  respiratory arrest, due to  COPD exacerbation  Contributing factors: Chronic AF, AKI  Autopsy: No  Smoking: yes    Merton Border, MD PCCM service Mobile [phone] Pager [phone] 12-07-17 3:08 PM

## 2017-12-20 NOTE — Progress Notes (Addendum)
Late entry after patient expired.  Fentanyl drip wasted with Marshall Cork, RN.  Attempted to waste in pyxis but unable to because patient had already been moved out of the pyxis.  Notified Sarah, RN charge nurse and Kennyth Lose, [place] and neither knew how to waste on a patient who had been discharged out of the system.  This RN called and spoke with Corene Cornea, [facility] and made him aware and Corene Cornea, [facility] did not know how to instruct this RN how to get patient back in pyxis.  300 mcg(30 mL) of fentanyl wasted with Marshall Cork, RN.

## 2017-12-20 NOTE — Progress Notes (Signed)
   12-27-17 1430  Clinical Encounter Type  Visited With Family  Visit Type Follow-up  Spiritual Encounters  Spiritual Needs Grief support  Chaplain checking back in with family. Family leaving but spoke with daughter regarding patient end of life.

## 2017-12-20 NOTE — Progress Notes (Signed)
Patient made comfort care today and was extubated this afternoon. He passed away at 1435 on 11-Dec-2017. Family at bedside.

## 2017-12-20 NOTE — Progress Notes (Signed)
Pt asystole but cardiac monitor, no heart tones heard over 60 seconds by Marshall Cork, [employer]. Pt time of expiration 39, MD Simonds notified, [facility] notified, [facility] notified and family present at time of expiration.

## 2017-12-20 NOTE — Progress Notes (Signed)
   12-21-2017 1215  Clinical Encounter Type  Visited With Patient and family together  Visit Type Follow-up  Referral From Nurse  Spiritual Encounters  Spiritual Needs Emotional  Stress Factors  Patient Stress Factors Health changes  Family Stress Factors Health changes  Chaplain responded to page and consulted with nurse prior to entering room. Chaplain visited with patient and family. Patient unresponsive. Chaplain assessed areas of support systems and self-care. Discussion surrounding patient's recent decline as well as history over last few years including conversations regarding end of life patient had had with family. Chaplain to check back in and encouraged family to have staff page chaplain as needed.

## 2017-12-20 NOTE — Progress Notes (Addendum)
Fentanyl drip wasted with Brittney, RN 357mcg (30 ml)

## 2017-12-20 DEATH — deceased

## 2018-03-10 ENCOUNTER — Ambulatory Visit: Payer: Medicare HMO | Admitting: Family

## 2019-10-01 IMAGING — DX DG CHEST 1V PORT
1 series · 1 of 1 positions shown · non-contrast
Comparison: 12/04/2017.

CLINICAL DATA: Respiratory failure.

EXAM:
PORTABLE CHEST 1 VIEW

[chest ap]
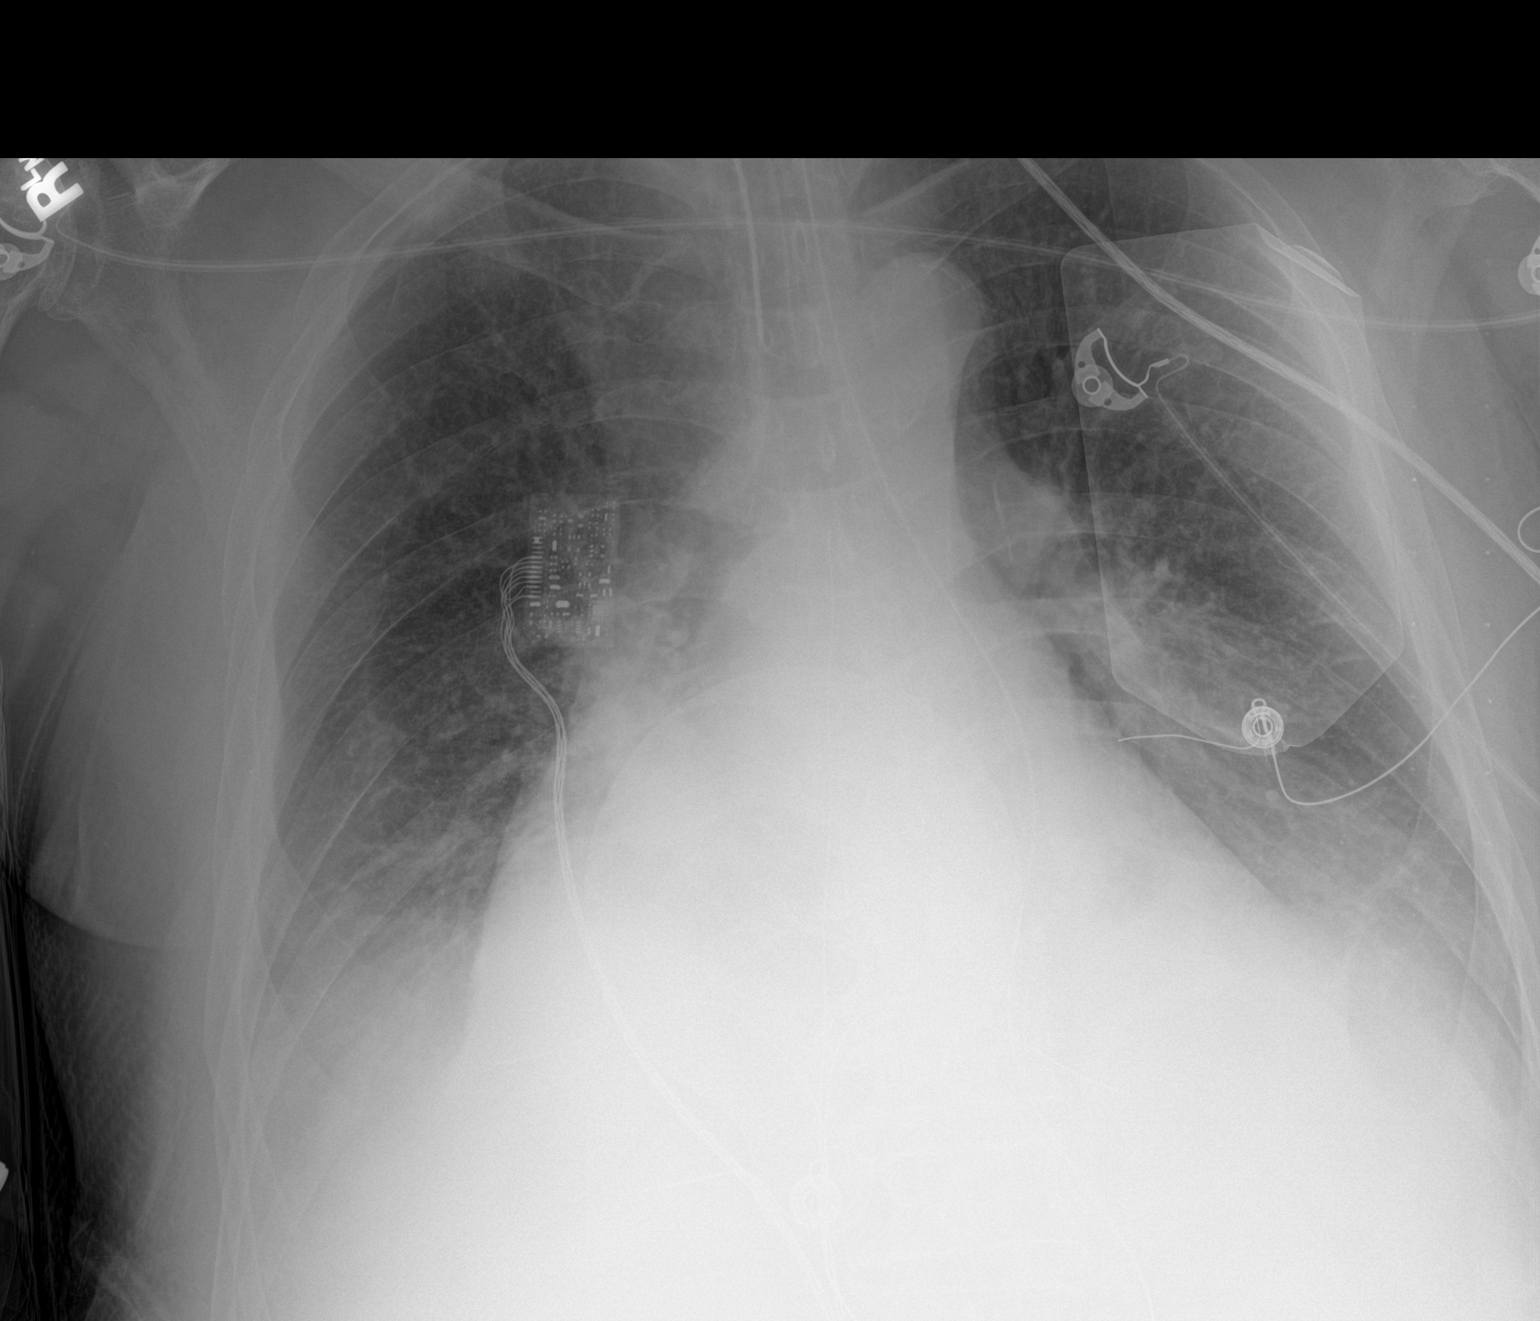

[1 of 1 positions shown; findings below may reference images not displayed]

FINDINGS: Endotracheal tube in stable position. Cardiomegaly with pulmonary
vascular prominence and bilateral interstitial prominence with
bilateral pleural effusions. Findings consistent with CHF.
Questionable nodular opacity right upper lobe. Follow-up PA lateral
chest x-ray is suggested with the patient's clinical capable.
IMPRESSION: 1.  Lines and tubes stable position.

2. Changes consistent with congestive heart failure bilateral from
interstitial edema bilateral pleural effusions noted on today's
exam.

3. Questionable nodular opacity right upper lobe. Follow-up PA
lateral chest x-ray suggested for further evaluation when the
patient is clinically capable.
